# Patient Record
Sex: Male | Born: 1950 | Race: White | Hispanic: No | Smoking: Never smoker
Health system: Southern US, Community
[De-identification: ages and names within clinical notes are randomized; demographics above are authoritative.]

## PROBLEM LIST (undated history)

## (undated) DIAGNOSIS — C61 Malignant neoplasm of prostate: Secondary | ICD-10-CM

## (undated) DIAGNOSIS — Z800 Family history of malignant neoplasm of digestive organs: Secondary | ICD-10-CM

## (undated) DIAGNOSIS — Z86010 Personal history of colonic polyps: Principal | ICD-10-CM

## (undated) DIAGNOSIS — Z87898 Personal history of other specified conditions: Secondary | ICD-10-CM

## (undated) DIAGNOSIS — R350 Frequency of micturition: Secondary | ICD-10-CM

## (undated) DIAGNOSIS — E119 Type 2 diabetes mellitus without complications: Secondary | ICD-10-CM

## (undated) DIAGNOSIS — Z87442 Personal history of urinary calculi: Secondary | ICD-10-CM

## (undated) DIAGNOSIS — E785 Hyperlipidemia, unspecified: Secondary | ICD-10-CM

## (undated) DIAGNOSIS — M1990 Unspecified osteoarthritis, unspecified site: Secondary | ICD-10-CM

## (undated) DIAGNOSIS — R7303 Prediabetes: Secondary | ICD-10-CM

## (undated) HISTORY — DX: Hyperlipidemia, unspecified: E78.5

## (undated) HISTORY — DX: Type 2 diabetes mellitus without complications: E11.9

## (undated) HISTORY — DX: Personal history of colonic polyps: Z86.010

## (undated) HISTORY — PX: COLONOSCOPY: SHX174

---

## 2014-12-20 ENCOUNTER — Encounter: Payer: Self-pay | Admitting: Internal Medicine

## 2015-01-22 ENCOUNTER — Encounter: Payer: Self-pay | Admitting: Gastroenterology

## 2015-01-28 ENCOUNTER — Encounter: Payer: Self-pay | Admitting: Gastroenterology

## 2015-02-20 ENCOUNTER — Ambulatory Visit (AMBULATORY_SURGERY_CENTER): Payer: Self-pay

## 2015-02-20 VITALS — Ht 68.5 in | Wt 250.0 lb

## 2015-02-20 DIAGNOSIS — Z1211 Encounter for screening for malignant neoplasm of colon: Secondary | ICD-10-CM

## 2015-02-20 MED ORDER — NA SULFATE-K SULFATE-MG SULF 17.5-3.13-1.6 GM/177ML PO SOLN: 1.0000 | Freq: Once | ORAL | Status: DC

## 2015-02-20 NOTE — Progress Notes (Signed)
No egg or soy allergies Not on home 02 No previous anesthesia complications No diet or weight loss meds 

## 2015-02-22 ENCOUNTER — Encounter: Payer: Self-pay | Admitting: Internal Medicine

## 2015-03-06 ENCOUNTER — Encounter: Payer: Self-pay | Admitting: Gastroenterology

## 2015-03-11 ENCOUNTER — Encounter: Payer: Self-pay | Admitting: Internal Medicine

## 2015-03-11 ENCOUNTER — Ambulatory Visit (AMBULATORY_SURGERY_CENTER): Payer: Medicare Other | Admitting: Internal Medicine

## 2015-03-11 VITALS — BP 117/66 | HR 57 | Temp 96.2°F | Resp 10 | Ht 68.5 in | Wt 250.0 lb

## 2015-03-11 DIAGNOSIS — Z1211 Encounter for screening for malignant neoplasm of colon: Secondary | ICD-10-CM | POA: Diagnosis not present

## 2015-03-11 DIAGNOSIS — D122 Benign neoplasm of ascending colon: Secondary | ICD-10-CM

## 2015-03-11 DIAGNOSIS — D123 Benign neoplasm of transverse colon: Secondary | ICD-10-CM

## 2015-03-11 DIAGNOSIS — Z860101 Personal history of adenomatous and serrated colon polyps: Secondary | ICD-10-CM

## 2015-03-11 DIAGNOSIS — Z800 Family history of malignant neoplasm of digestive organs: Secondary | ICD-10-CM | POA: Diagnosis not present

## 2015-03-11 DIAGNOSIS — Z86010 Personal history of colonic polyps: Secondary | ICD-10-CM

## 2015-03-11 HISTORY — DX: Personal history of colonic polyps: Z86.010

## 2015-03-11 HISTORY — DX: Personal history of adenomatous and serrated colon polyps: Z86.0101

## 2015-03-11 MED ORDER — SODIUM CHLORIDE 0.9 % IV SOLN: 500.0000 mL | INTRAVENOUS | Status: DC

## 2015-03-11 NOTE — Progress Notes (Signed)
Called to room to assist during endoscopic procedure.  Patient ID and intended procedure confirmed with present staff. Received instructions for my participation in the procedure from the performing physician.  

## 2015-03-11 NOTE — Patient Instructions (Addendum)
I found and removed 2 tiny polyps that look benign.  I will let you know pathology results and when to have another routine colonoscopy by mail.  You also have a condition called diverticulosis - common and not usually a problem. Please read the handout provided.  I appreciate the opportunity to care for you. Gatha Mayer, MD, FACG     YOU HAD AN ENDOSCOPIC PROCEDURE TODAY AT [facility]:   Refer to the procedure report that was given to you for any specific questions about what was found during the examination.  If the procedure report does not answer your questions, please call your gastroenterologist to clarify.  If you requested that your care partner not be given the details of your procedure findings, then the procedure report has been included in a sealed envelope for you to review at your convenience later.  YOU SHOULD EXPECT: Some feelings of bloating in the abdomen. Passage of more gas than usual.  Walking can help get rid of the air that was put into your GI tract during the procedure and reduce the bloating. If you had a lower endoscopy (such as a colonoscopy or flexible sigmoidoscopy) you may notice spotting of blood in your stool or on the toilet paper. If you underwent a bowel prep for your procedure, you may not have a normal bowel movement for a few days.  Please Note:  You might notice some irritation and congestion in your nose or some drainage.  This is from the oxygen used during your procedure.  There is no need for concern and it should clear up in a day or so.  SYMPTOMS TO REPORT IMMEDIATELY:   Following lower endoscopy (colonoscopy or flexible sigmoidoscopy):  Excessive amounts of blood in the stool  Significant tenderness or worsening of abdominal pains  Swelling of the abdomen that is new, acute  Fever of 100F or higher    For urgent or emergent issues, a gastroenterologist can be reached at any hour by calling [phone]  [phone].   DIET: Your first meal following the procedure should be a small meal and then it is ok to progress to your normal diet. Heavy or fried foods are harder to digest and may make you feel nauseous or bloated.  Likewise, meals heavy in dairy and vegetables can increase bloating.  Drink plenty of fluids but you should avoid alcoholic beverages for 24 hours.  ACTIVITY:  You should plan to take it easy for the rest of today and you should NOT DRIVE or use heavy machinery until tomorrow (because of the sedation medicines used during the test).    FOLLOW UP: Our staff will call the number listed on your records the next business day following your procedure to check on you and address any questions or concerns that you may have regarding the information given to you following your procedure. If we do not reach you, we will leave a message.  However, if you are feeling well and you are not experiencing any problems, there is no need to return our call.  We will assume that you have returned to your regular daily activities without incident.  If any biopsies were taken you will be contacted by phone or by letter within the next 1-3 weeks.  Please call us at [phone] if you have not heard about the biopsies in 3 weeks.    SIGNATURES/CONFIDENTIALITY: You and/or your care partner have signed paperwork which will be entered into your  electronic medical record.  These signatures attest to the fact that that the information above on your After Visit Summary has been reviewed and is understood.  Full responsibility of the confidentiality of this discharge information lies with you and/or your care-partner.   Resume medications. Information given on polyps and diverticulosis.

## 2015-03-11 NOTE — Progress Notes (Signed)
To recovery, report to Brown, RN, VSS. 

## 2015-03-11 NOTE — Op Note (Signed)
[facility]  [address] [place], [id number]   COLONOSCOPY PROCEDURE REPORT  PATIENT: Aiken, Aragona  MR#: [id number] BIRTHDATE: June 02, 1950 , 63  yrs. old GENDER: male ENDOSCOPIST: Gatha Mayer, MD, [facility] PROCEDURE DATE:  03/11/2015 PROCEDURE:   Colonoscopy, screening and Colonoscopy with snare polypectomy First Screening Colonoscopy - Avg.  risk and is 50 yrs.  old or older - No.  Prior Negative Screening - Now for repeat screening. 10 or more years since last screening  History of Adenoma - Now for follow-up colonoscopy & has been > or = to 3 yrs.  N/A  Polyps removed today? Yes ASA CLASS:   Class II INDICATIONS:Screening for colonic neoplasia and FH Colon or Rectal Adenocarcinoma. MEDICATIONS: Propofol 300 mg IV and Monitored anesthesia care  DESCRIPTION OF PROCEDURE:   After the risks benefits and alternatives of the procedure were thoroughly explained, informed consent was obtained.  The digital rectal exam revealed no abnormalities of the rectum.   The LB [id number]  endoscope was introduced through the anus and advanced to the cecum, which was identified by both the appendix and ileocecal valve. No adverse events experienced.   The quality of the prep was excellent. (MiraLax was used)  The instrument was then slowly withdrawn as the colon was fully examined. Estimated blood loss is zero unless otherwise noted in this procedure report.      COLON FINDINGS: Two sessile polyps ranging from 3 to 20mm in size were found in the transverse colon and ascending colon. Polypectomies were performed with a cold snare.  The resection was complete, the polyp tissue was completely retrieved and sent to histology.   The examination was otherwise normal.   Right colon retroflexion included.  Retroflexed views revealed no abnormalities. The time to cecum = 1.6 Withdrawal time = 11.9   The scope was withdrawn and the procedure completed. COMPLICATIONS: There  were no immediate complications.  ENDOSCOPIC IMPRESSION: 1.   Two sessile polyps ranging from 3 to 22mm in size were found in the transverse colon and ascending colon; polypectomies were performed with a cold snare 2.   The examination was otherwise normal 3.   Right colon retroflexion included  RECOMMENDATIONS: Timing of repeat colonoscopy will be determined by pathology findings and family hx issues (mother 98's father 76's) Likely 2021 eSigned:  Gatha Mayer, MD, [facility] 03/11/2015 9:34 AM   cc: Janalyn Rouse, MD and The Patient

## 2015-03-12 ENCOUNTER — Telehealth: Payer: Self-pay | Admitting: *Deleted

## 2015-03-12 NOTE — Telephone Encounter (Signed)
On f/u callback-voice mailbox full not accepting messages

## 2015-03-24 ENCOUNTER — Encounter: Payer: Self-pay | Admitting: Internal Medicine

## 2015-03-24 DIAGNOSIS — Z86010 Personal history of colonic polyps: Secondary | ICD-10-CM | POA: Insufficient documentation

## 2015-03-24 DIAGNOSIS — Z860101 Personal history of adenomatous and serrated colon polyps: Secondary | ICD-10-CM | POA: Insufficient documentation

## 2015-03-24 NOTE — Progress Notes (Signed)
Quick Note:  2 diminutive adenomas + FFHx CRCA Recall colon 2021 ______

## 2015-07-14 ENCOUNTER — Encounter (HOSPITAL_COMMUNITY): Payer: Self-pay | Admitting: *Deleted

## 2015-07-14 ENCOUNTER — Emergency Department (HOSPITAL_COMMUNITY)
Admission: EM | Admit: 2015-07-14 | Discharge: 2015-07-14 | Disposition: A | Discharge status: Home / Self Care | Payer: Medicare Other | Attending: Physician Assistant | Admitting: Physician Assistant

## 2015-07-14 DIAGNOSIS — E669 Obesity, unspecified: Secondary | ICD-10-CM | POA: Diagnosis not present

## 2015-07-14 DIAGNOSIS — Z86010 Personal history of colonic polyps: Secondary | ICD-10-CM | POA: Diagnosis not present

## 2015-07-14 DIAGNOSIS — R509 Fever, unspecified: Secondary | ICD-10-CM | POA: Diagnosis present

## 2015-07-14 DIAGNOSIS — A084 Viral intestinal infection, unspecified: Secondary | ICD-10-CM | POA: Diagnosis not present

## 2015-07-14 LAB — URINE MICROSCOPIC-ADD ON
Bacteria, UA: NONE SEEN
SQUAMOUS EPITHELIAL / LPF: NONE SEEN
WBC UA: NONE SEEN WBC/hpf (ref 0–5)

## 2015-07-14 LAB — URINALYSIS, ROUTINE W REFLEX MICROSCOPIC
BILIRUBIN URINE: NEGATIVE
Glucose, UA: NEGATIVE mg/dL
KETONES UR: NEGATIVE mg/dL
Leukocytes, UA: NEGATIVE
NITRITE: NEGATIVE
Protein, ur: NEGATIVE mg/dL
Specific Gravity, Urine: 1.018 (ref 1.005–1.030)
pH: 7 (ref 5.0–8.0)

## 2015-07-14 LAB — CBC
HCT: 42.2 % (ref 39.0–52.0)
Hemoglobin: 14.3 g/dL (ref 13.0–17.0)
MCH: 28.9 pg (ref 26.0–34.0)
MCHC: 33.9 g/dL (ref 30.0–36.0)
MCV: 85.3 fL (ref 78.0–100.0)
Platelets: 207 10*3/uL (ref 150–400)
RBC: 4.95 MIL/uL (ref 4.22–5.81)
RDW: 12.4 % (ref 11.5–15.5)
WBC: 8.4 10*3/uL (ref 4.0–10.5)

## 2015-07-14 LAB — COMPREHENSIVE METABOLIC PANEL
ALBUMIN: 3.9 g/dL (ref 3.5–5.0)
ALK PHOS: 90 U/L (ref 38–126)
ALT: 18 U/L (ref 17–63)
AST: 21 U/L (ref 15–41)
Anion gap: 14 (ref 5–15)
BILIRUBIN TOTAL: 0.8 mg/dL (ref 0.3–1.2)
BUN: 13 mg/dL (ref 6–20)
CALCIUM: 9.2 mg/dL (ref 8.9–10.3)
CO2: 20 mmol/L — ABNORMAL LOW (ref 22–32)
CREATININE: 1.13 mg/dL (ref 0.61–1.24)
Chloride: 104 mmol/L (ref 101–111)
GFR calc Af Amer: >60 mL/min (ref >60)
GFR calc non Af Amer: >60 mL/min (ref >60)
GLUCOSE: 172 mg/dL — AB (ref 65–99)
Potassium: 4.3 mmol/L (ref 3.5–5.1)
Sodium: 138 mmol/L (ref 135–145)
TOTAL PROTEIN: 7 g/dL (ref 6.5–8.1)

## 2015-07-14 LAB — LIPASE, BLOOD: Lipase: 24 U/L (ref 11–51)

## 2015-07-14 MED ORDER — ONDANSETRON 4 MG PO TBDP
ORAL_TABLET | ORAL | Status: AC
  Filled 2015-07-14: qty 1

## 2015-07-14 MED ORDER — ONDANSETRON HCL 4 MG PO TABS: 4.0000 mg | ORAL_TABLET | Freq: Three times a day (TID) | ORAL | Status: DC | PRN

## 2015-07-14 MED ORDER — SODIUM CHLORIDE 0.9 % IV BOLUS (SEPSIS)
1000.0000 mL | Freq: Once | INTRAVENOUS | Status: AC
  Administered 2015-07-14: 1000 mL via INTRAVENOUS

## 2015-07-14 MED ORDER — ONDANSETRON 4 MG PO TBDP
4.0000 mg | ORAL_TABLET | Freq: Once | ORAL | Status: AC | PRN
  Administered 2015-07-14: 4 mg via ORAL

## 2015-07-14 NOTE — ED Provider Notes (Signed)
CSN: [id number]     Arrival date & time 07/14/15  1058 History   First MD Initiated Contact with Patient 07/14/15 1219     Chief Complaint  Patient presents with  . Abdominal Pain  . Fever     (Consider location/radiation/quality/duration/timing/severity/associated sxs/prior Treatment) HPI   Pt is a 65 year old presents with nausea vomiting.  Vomited several times between 10 and now today. No diarrhea yet.  NO fever. Endorses chills. Patient had no abnormal eating behavior.   Normal bowel movement here. + Gas.     Past Medical History  Diagnosis Date  . Hx of adenomatous colonic polyps 03/24/2015  . Obesity    Past Surgical History  Procedure Laterality Date  . Hand surgery Left Jan 2016    left thumb  . Colonoscopy     Family History  Problem Relation Age of Onset  . Colon cancer Mother 70  . Colon cancer Father 19   Social History  Substance Use Topics  . Smoking status: Never Smoker   . Smokeless tobacco: Never Used  . Alcohol Use: 0.0 oz/week    0 Standard drinks or equivalent per week     Comment: occasionally    Review of Systems  Constitutional: Positive for chills. Negative for fever and activity change.  Respiratory: Negative for shortness of breath.   Cardiovascular: Negative for chest pain.  Gastrointestinal: Positive for nausea and vomiting. Negative for abdominal pain and diarrhea.  Genitourinary: Negative for dysuria.  Psychiatric/Behavioral: Negative for agitation.      Allergies  Review of patient's allergies indicates no known allergies.  Home Medications   Prior to Admission medications   Medication Sig Start Date End Date Taking? Authorizing Provider  ondansetron (ZOFRAN) 4 MG tablet Take 1 tablet (4 mg total) by mouth every 8 (eight) hours as needed for nausea or vomiting. 07/14/15   Ebubechukwu Jedlicka Lyn Aiyla Baucom, MD   BP 100/64 mmHg  Pulse 63  Temp(Src) 98.2 F (36.8 C) (Oral)  Resp 18  SpO2 97% Physical Exam  Constitutional: He is  oriented to person, place, and time. He appears well-nourished.  HENT:  Head: Normocephalic.  Mouth/Throat: Oropharynx is clear and moist.  Eyes: Conjunctivae are normal.  Neck: No tracheal deviation present.  Cardiovascular: Normal rate.   Pulmonary/Chest: Effort normal. No stridor. No respiratory distress.  Abdominal: Soft. There is no tenderness. There is no guarding.  No tenderness  Musculoskeletal: Normal range of motion. He exhibits no edema.  Neurological: He is oriented to person, place, and time. No cranial nerve deficit.  Skin: Skin is warm and dry. No rash noted. He is not diaphoretic.  Psychiatric: He has a normal mood and affect. His behavior is normal.  Nursing note and vitals reviewed.   ED Course  Procedures (including critical care time) Labs Review Labs Reviewed  COMPREHENSIVE METABOLIC PANEL - Abnormal; Notable for the following:    CO2 20 (*)    Glucose, Bld 172 (*)    All other components within normal limits  URINALYSIS, ROUTINE W REFLEX MICROSCOPIC (NOT AT [facility]) - Abnormal; Notable for the following:    Hgb urine dipstick LARGE (*)    All other components within normal limits  LIPASE, BLOOD  CBC  URINE MICROSCOPIC-ADD ON    Imaging Review No results found. I have personally reviewed and evaluated these images and lab results as part of my medical decision-making.   EKG Interpretation   Date/Time:  Sunday July 14 2015 16:33:02 EDT Ventricular Rate:  60 PR Interval:  143 QRS Duration: 91 QT Interval:  408 QTC Calculation: 408 R Axis:   -59 Text Interpretation:  Sinus rhythm Left anterior fascicular block Low  voltage, precordial leads Consider anterior infarct Baseline wander in  lead(s) V3 no acute ischemia  No old tracing to compare Confirmed by  Gerald Leitz ([id number]) on 07/14/2015 4:45:07 PM      MDM   Final diagnoses:  Viral gastroenteritis    Patietn is a 65 year old male presenting With feeling like he is going to gag 2-3  times. Patient vomited one time. No vomiting. This all happened within the last 2 hours. Patient has no fever, chills, abdominal pain. Patient feels a little gassy. We'll give Zofran and try by mouth challenge.  It appears to me this is the start of viral gastroenteritis which is going around the community.   4:45 PM Patient has not vomited here. Patient is able to tolerate ice chips without issue. He feels much improved. We'll have him follow-up with our care physician as needed.  Meagan Ancona Julio Alm, MD 07/14/15 1645

## 2015-07-14 NOTE — Discharge Instructions (Signed)
Please return with any chest pain, or other concerns.   Viral Gastroenteritis Viral gastroenteritis is also known as stomach flu. This condition affects the stomach and intestinal tract. It can cause sudden diarrhea and vomiting. The illness typically lasts 3 to 8 days. Most people develop an immune response that eventually gets rid of the virus. While this natural response develops, the virus can make you quite ill. CAUSES  Many different viruses can cause gastroenteritis, such as rotavirus or noroviruses. You can catch one of these viruses by consuming contaminated food or water. You may also catch a virus by sharing utensils or other personal items with an infected person or by touching a contaminated surface. SYMPTOMS  The most common symptoms are diarrhea and vomiting. These problems can cause a severe loss of body fluids (dehydration) and a body salt (electrolyte) imbalance. Other symptoms may include:  Fever.  Headache.  Fatigue.  Abdominal pain. DIAGNOSIS  Your caregiver can usually diagnose viral gastroenteritis based on your symptoms and a physical exam. A stool sample may also be taken to test for the presence of viruses or other infections. TREATMENT  This illness typically goes away on its own. Treatments are aimed at rehydration. The most serious cases of viral gastroenteritis involve vomiting so severely that you are not able to keep fluids down. In these cases, fluids must be given through an intravenous line (IV). HOME CARE INSTRUCTIONS   Drink enough fluids to keep your urine clear or pale yellow. Drink small amounts of fluids frequently and increase the amounts as tolerated.  Ask your caregiver for specific rehydration instructions.  Avoid:  Foods high in sugar.  Alcohol.  Carbonated drinks.  Tobacco.  Juice.  Caffeine drinks.  Extremely hot or cold fluids.  Fatty, greasy foods.  Too much intake of anything at one time.  Dairy products until 24 to 48  hours after diarrhea stops.  You may consume probiotics. Probiotics are active cultures of beneficial bacteria. They may lessen the amount and number of diarrheal stools in adults. Probiotics can be found in yogurt with active cultures and in supplements.  Wash your hands well to avoid spreading the virus.  Only take over-the-counter or prescription medicines for pain, discomfort, or fever as directed by your caregiver. Do not give aspirin to children. Antidiarrheal medicines are not recommended.  Ask your caregiver if you should continue to take your regular prescribed and over-the-counter medicines.  Keep all follow-up appointments as directed by your caregiver. SEEK IMMEDIATE MEDICAL CARE IF:   You are unable to keep fluids down.  You do not urinate at least once every 6 to 8 hours.  You develop shortness of breath.  You notice blood in your stool or vomit. This may look like coffee grounds.  You have abdominal pain that increases or is concentrated in one small area (localized).  You have persistent vomiting or diarrhea.  You have a fever.  The patient is a child younger than 3 months, and he or she has a fever.  The patient is a child older than 3 months, and he or she has a fever and persistent symptoms.  The patient is a child older than 3 months, and he or she has a fever and symptoms suddenly get worse.  The patient is a baby, and he or she has no tears when crying. MAKE SURE YOU:   Understand these instructions.  Will watch your condition.  Will get help right away if you are not doing well or  get worse.   This information is not intended to replace advice given to you by your health care provider. Make sure you discuss any questions you have with your health care provider.   Document Released: 04/13/2005 Document Revised: 07/06/2011 Document Reviewed: 01/28/2011 Elsevier Interactive Patient Education [employer].

## 2015-07-14 NOTE — ED Notes (Signed)
Pt reports onset this morning of RLQ pressure, chills/fever and n/v. Denies diarrhea.

## 2015-07-18 ENCOUNTER — Other Ambulatory Visit (HOSPITAL_COMMUNITY): Payer: Self-pay | Admitting: Internal Medicine

## 2015-07-18 ENCOUNTER — Ambulatory Visit (HOSPITAL_COMMUNITY)
Admission: RE | Admit: 2015-07-18 | Discharge: 2015-07-18 | Disposition: A | Discharge status: Home / Self Care | Payer: Medicare Other | Source: Ambulatory Visit | Attending: Internal Medicine | Admitting: Internal Medicine

## 2015-07-18 DIAGNOSIS — R339 Retention of urine, unspecified: Secondary | ICD-10-CM

## 2015-07-18 DIAGNOSIS — N329 Bladder disorder, unspecified: Secondary | ICD-10-CM | POA: Insufficient documentation

## 2015-07-18 DIAGNOSIS — N200 Calculus of kidney: Secondary | ICD-10-CM | POA: Diagnosis not present

## 2015-07-18 DIAGNOSIS — N132 Hydronephrosis with renal and ureteral calculous obstruction: Secondary | ICD-10-CM | POA: Diagnosis not present

## 2015-07-18 DIAGNOSIS — N201 Calculus of ureter: Secondary | ICD-10-CM | POA: Diagnosis not present

## 2016-05-05 HISTORY — PX: PROSTATE BIOPSY: SHX241

## 2016-05-22 ENCOUNTER — Encounter: Payer: Self-pay | Admitting: Radiation Oncology

## 2016-06-03 NOTE — Progress Notes (Signed)
GU Location of Tumor / Histology: prostatic adenocarcinoma  If Prostate Cancer, Gleason Score is (3 + 3= 6). Prostate volume 31.7 grams.    Biopsies of prostate (if applicable) revealed:    Past/Anticipated interventions by urology, if any: biopsy, referral to Dr. Tammi Klippel to discuss radiation therapy options. Patient most interested in seeds.  Past/Anticipated interventions by medical oncology, if any: no  Weight changes, if any: no  Bowel/Bladder complaints, if any: IPSS 2 with nocturia x 2. Denies dysuria, hematuria, incontinence, or leakage.   Nausea/Vomiting, if any: no  Pain issues, if any:  no  SAFETY ISSUES:  Prior radiation? no  Pacemaker/ICD? no  Possible current pregnancy? no  Is the patient on methotrexate? no  Current Complaints / other details: 66 year old male. Married. Retired.

## 2016-06-08 ENCOUNTER — Encounter: Payer: Self-pay | Admitting: Medical Oncology

## 2016-06-08 ENCOUNTER — Ambulatory Visit
Admission: RE | Admit: 2016-06-08 | Discharge: 2016-06-08 | Disposition: A | Discharge status: Home / Self Care | Payer: Medicare Other | Source: Ambulatory Visit | Attending: Radiation Oncology | Admitting: Radiation Oncology

## 2016-06-08 ENCOUNTER — Encounter: Payer: Self-pay | Admitting: Radiation Oncology

## 2016-06-08 VITALS — BP 134/97 | HR 89 | Resp 18 | Ht 68.0 in | Wt 244.0 lb

## 2016-06-08 DIAGNOSIS — E119 Type 2 diabetes mellitus without complications: Secondary | ICD-10-CM | POA: Insufficient documentation

## 2016-06-08 DIAGNOSIS — C61 Malignant neoplasm of prostate: Secondary | ICD-10-CM

## 2016-06-08 DIAGNOSIS — Z800 Family history of malignant neoplasm of digestive organs: Secondary | ICD-10-CM | POA: Diagnosis not present

## 2016-06-08 DIAGNOSIS — E669 Obesity, unspecified: Secondary | ICD-10-CM | POA: Diagnosis not present

## 2016-06-08 DIAGNOSIS — Z833 Family history of diabetes mellitus: Secondary | ICD-10-CM | POA: Diagnosis not present

## 2016-06-08 DIAGNOSIS — Z86010 Personal history of colonic polyps: Secondary | ICD-10-CM | POA: Diagnosis not present

## 2016-06-08 DIAGNOSIS — Z6837 Body mass index (BMI) 37.0-37.9, adult: Secondary | ICD-10-CM | POA: Diagnosis not present

## 2016-06-08 DIAGNOSIS — Z9889 Other specified postprocedural states: Secondary | ICD-10-CM | POA: Diagnosis not present

## 2016-06-08 DIAGNOSIS — Z7982 Long term (current) use of aspirin: Secondary | ICD-10-CM | POA: Diagnosis not present

## 2016-06-08 HISTORY — DX: Malignant neoplasm of prostate: C61

## 2016-06-08 NOTE — Progress Notes (Signed)
See progress note under physician encounter. 

## 2016-06-08 NOTE — Progress Notes (Signed)
Radiation Oncology         [phone] ________________________________  Initial outpatient Consultation  Name: Calvin Salazar MRN: [id number]  Date: 06/08/2016  DOB: 09/29/1950  [id number], Gwyndolyn Saxon, MD  McKenzie, Candee Furbish, MD   REFERRING PHYSICIAN: Cleon Gustin, MD  DIAGNOSIS: 65 year-old gentleman with Stage T1c adenocarcinoma of the prostate with Gleason Score of 3+3, and PSA of 6.9.     ICD-9-CM ICD-10-CM   1. Malignant neoplasm of prostate ([place]) 185 C61     HISTORY OF PRESENT ILLNESS: Calvin Salazar is a 66 y.o. male with a diagnosis of prostate cancer. He was noted to have an elevated PSA of 4.03 December 2015 by his primary care physician, Dr. Brigitte Pulse. Subsequent PSA levels have increased with his most recent PSA being 6.02 January 2016. Accordingly, he was referred for evaluation in urology by Dr. Alyson Ingles on 05/05/2016.  He had not had a prostate nodule on a physical examination.  The patient proceeded to transrectal ultrasound with 12 biopsies of the prostate on 05/05/2016.  The prostate volume measured 31.7 cc.  Out of 12 core biopsies, 10 were positive.  The maximum Gleason score was 3+3, and this was seen in the left base lateral, left mid lateral, left apex lateral, left base, left mid, right base, right mid, right apex, right base lateral, and right apex lateral.  The patient reviewed the biopsy results with his urologist and he has kindly been referred today for discussion of potential radiation treatment options.      PREVIOUS RADIATION THERAPY: No  PAST MEDICAL HISTORY:  Past Medical History:  Diagnosis Date  . Diabetes mellitus without complication ([place])   . Hx of adenomatous colonic polyps 03/24/2015  . Obesity   . Prostate cancer ([place])       PAST SURGICAL HISTORY: Past Surgical History:  Procedure Laterality Date  . COLONOSCOPY    . HAND SURGERY Left Jan 2016   left thumb  . PROSTATE BIOPSY      FAMILY HISTORY:  Family History  Problem Relation  Age of Onset  . Colon cancer Mother 19    intestines  . Colon cancer Father 60  . Diabetes Maternal Grandmother     SOCIAL HISTORY:  Social History   Social History  . Marital status: Unknown    Spouse name: N/A  . Number of children: N/A  . Years of education: N/A   Occupational History  . Not on file.   Social History Main Topics  . Smoking status: Never Smoker  . Smokeless tobacco: Never Used  . Alcohol use 0.0 oz/week     Comment: occasionally  . Drug use: No  . Sexual activity: Not on file   Other Topics Concern  . Not on file   Social History Narrative  . No narrative on file    ALLERGIES: Patient has no known allergies.  MEDICATIONS:  Current Outpatient Prescriptions  Medication Sig Dispense Refill  . aspirin EC 81 MG tablet Take 81 mg by mouth daily.    Marland Kitchen CINNAMON PO Take by mouth.    Marland Kitchen guaiFENesin (MUCINEX) 600 MG 12 hr tablet Take by mouth 2 (two) times daily.     No current facility-administered medications for this encounter.     REVIEW OF SYSTEMS:  On review of systems, the patient reports that he is doing well overall. He denies any chest pain, shortness of breath, cough, fevers, chills, night sweats, unintended weight changes. He denies any bowel disturbances, and denies  abdominal pain, nausea or vomiting. He denies any new musculoskeletal or joint aches or pains. His IPSS today was 2, indicating mild urinary symptoms. He reports nocturia x 2. He denies dysuria, hematuria, incontinence, or leakage. Previously on 05/21/2016 his IPSS was reported as 12, indicating moderate urinary symptoms. He is able to complete sexual activity with most attempts. A complete review of systems is obtained and is otherwise negative.    PHYSICAL EXAM:  Wt Readings from Last 3 Encounters:  06/08/16 244 lb (110.7 kg)  03/11/15 250 lb (113.4 kg)  02/20/15 250 lb (113.4 kg)   Temp Readings from Last 3 Encounters:  07/14/15 98.2 F (36.8 C) (Oral)  03/11/15 (!) 96.2 F  (35.7 C) (Tympanic)   BP Readings from Last 3 Encounters:  06/08/16 (!) 134/97  07/14/15 123/83  03/11/15 117/66   Pulse Readings from Last 3 Encounters:  06/08/16 89  07/14/15 70  03/11/15 (!) 57   Pain Assessment Pain Score: 0-No pain/10  In general this is a well appearing caucasian male in no acute distress. He's alert and oriented x4 and appropriate throughout the examination. Cardiopulmonary assessment is negative for acute distress and he exhibits normal effort.    KPS = 100  100 - Normal; no complaints; no evidence of disease. 90   - Able to carry on normal activity; minor signs or symptoms of disease. 80   - Normal activity with effort; some signs or symptoms of disease. 62   - Cares for self; unable to carry on normal activity or to do active work. 60   - Requires occasional assistance, but is able to care for most of his personal needs. 50   - Requires considerable assistance and frequent medical care. 65   - Disabled; requires special care and assistance. 2   - Severely disabled; hospital admission is indicated although death not imminent. 44   - Very sick; hospital admission necessary; active supportive treatment necessary. 10   - Moribund; fatal processes progressing rapidly. 0     - Dead  Karnofsky DA, Abelmann [place], Craver LS and Burchenal [facility] [phone]) The use of the nitrogen mustards in the palliative treatment of carcinoma: with particular reference to bronchogenic carcinoma Cancer 1 634-56  LABORATORY DATA:  Lab Results  Component Value Date   WBC 8.4 07/14/2015   HGB 14.3 07/14/2015   HCT 42.2 07/14/2015   MCV 85.3 07/14/2015   PLT 207 07/14/2015   Lab Results  Component Value Date   NA 138 07/14/2015   K 4.3 07/14/2015   CL 104 07/14/2015   CO2 20 (L) 07/14/2015   Lab Results  Component Value Date   ALT 18 07/14/2015   AST 21 07/14/2015   ALKPHOS 90 07/14/2015   BILITOT 0.8 07/14/2015     RADIOGRAPHY: No results found.     IMPRESSION/PLAN: 1. 66 y.o. gentleman with low risk, Stage T1c adenocarcinoma of the prostate with Gleason Score of 3+3, and PSA of 6.9.  We discussed the natural history of prostate cancer.  We reviewed the the implications of T-stage, Gleason's Score, and PSA on decision-making and outcomes in prostate cancer.  We discussed radiation treatment in the management of prostate cancer with regard to the logistics and delivery of external beam radiation treatment as well as the logistics and delivery of prostate brachytherapy.  We compared and contrasted each of these approaches and also compared these against prostatectomy.  The patient expressed interest in prostate brachytherapy. The patient would like to proceed with  prostate brachytherapy.  I will share my findings with Dr. Alyson Ingles and move forward with scheduling the procedure in the near future.     ------------------------------------------------   Tyler Pita, MD [place] Director and Director of Stereotactic Radiosurgery Direct Dial: [phone]  Fax: [phone] [email or web address]  Skype  [email or web address]  This document serves as a record of services personally performed by Tyler Pita, MD. It was created on his behalf by Arlyce Harman, a trained medical scribe. The creation of this record is based on the scribe's personal observations and the provider's statements to them. This document has been checked and approved by the attending provider.

## 2016-06-10 ENCOUNTER — Other Ambulatory Visit: Payer: Self-pay | Admitting: Urology

## 2016-06-10 ENCOUNTER — Telehealth: Payer: Self-pay | Admitting: *Deleted

## 2016-06-10 NOTE — Telephone Encounter (Signed)
Called patient to inform of pre-seed planning CT on 06-19-16 and his implant on 08-17-16 @ 2 pm, spoke with patient and he is aware of these appts.

## 2016-06-18 ENCOUNTER — Telehealth: Payer: Self-pay | Admitting: *Deleted

## 2016-06-18 NOTE — Telephone Encounter (Signed)
xxxxx 

## 2016-06-18 NOTE — Telephone Encounter (Signed)
Called patient to remind of pre-seed appts. For 06-19-16, spoke with patient and he is aware of these appts.

## 2016-06-19 ENCOUNTER — Ambulatory Visit (HOSPITAL_COMMUNITY)
Admission: RE | Admit: 2016-06-19 | Discharge: 2016-06-19 | Disposition: A | Discharge status: Home / Self Care | Payer: Medicare Other | Source: Ambulatory Visit | Attending: Urology | Admitting: Urology

## 2016-06-19 ENCOUNTER — Ambulatory Visit
Admission: RE | Admit: 2016-06-19 | Discharge: 2016-06-19 | Disposition: A | Discharge status: Home / Self Care | Payer: Medicare Other | Source: Ambulatory Visit | Attending: Radiation Oncology | Admitting: Radiation Oncology

## 2016-06-19 ENCOUNTER — Encounter: Payer: Self-pay | Admitting: Medical Oncology

## 2016-06-19 ENCOUNTER — Other Ambulatory Visit: Payer: Self-pay

## 2016-06-19 DIAGNOSIS — C61 Malignant neoplasm of prostate: Secondary | ICD-10-CM

## 2016-06-19 DIAGNOSIS — Z01818 Encounter for other preprocedural examination: Secondary | ICD-10-CM | POA: Insufficient documentation

## 2016-06-19 NOTE — Progress Notes (Signed)
  Radiation Oncology         (336) [phone] ________________________________  Name: Calvin Salazar MRN: [id number]  Date: 06/19/2016  DOB: 1950/09/03  SIMULATION AND TREATMENT PLANNING NOTE PUBIC ARCH STUDY  [id number], Gwyndolyn Saxon, MD  Cleon Gustin, MD  DIAGNOSIS: 66 y.o. gentleman with Stage T1c adenocarcinoma of the prostate with Gleason Score of 3+3, and PSA of 6.9    ICD-9-CM ICD-10-CM   1. Malignant neoplasm of prostate ([place]) 185 C61     COMPLEX SIMULATION:  The patient presented today for evaluation for possible prostate seed implant. He was brought to the radiation planning suite and placed supine on the CT couch. A 3-dimensional image study set was obtained in upload to the planning computer. There, on each axial slice, I contoured the prostate gland. Then, using three-dimensional radiation planning tools I reconstructed the prostate in view of the structures from the transperineal needle pathway to assess for possible pubic arch interference. In doing so, I did not appreciate any pubic arch interference. Also, the patient's prostate volume was estimated based on the drawn structure. The volume was 30 cc.  Given the pubic arch appearance and prostate volume, patient remains a good candidate to proceed with prostate seed implant. Today, he freely provided informed written consent to proceed.     PLAN: The patient will undergo prostate seed implant.   ________________________________  Sheral Apley. Tammi Klippel, M.D.  This document serves as a record of services personally performed by Tyler Pita, MD. It was created on his behalf by Darcus Austin, a trained medical scribe. The creation of this record is based on the scribe's personal observations and the provider's statements to them. This document has been checked and approved by the attending provider.

## 2016-07-01 ENCOUNTER — Other Ambulatory Visit: Payer: Self-pay | Admitting: Urology

## 2016-07-12 IMAGING — CT CT RENAL STONE PROTOCOL
2 of 3 series · 16 of 45 positions shown, 18 images · non-contrast
Comparison: None.

CLINICAL DATA: Urinary retention for 3 days. Right greater than
left flank pain.

EXAM:
CT ABDOMEN AND PELVIS WITHOUT CONTRAST
TECHNIQUE: Multidetector CT imaging of the abdomen and pelvis was performed
following the standard protocol without IV contrast.

[Series 3: coronal · coronal · 0.84mm/px · 3 of 111 slices shown]
[im 37/111  soft-tissue]
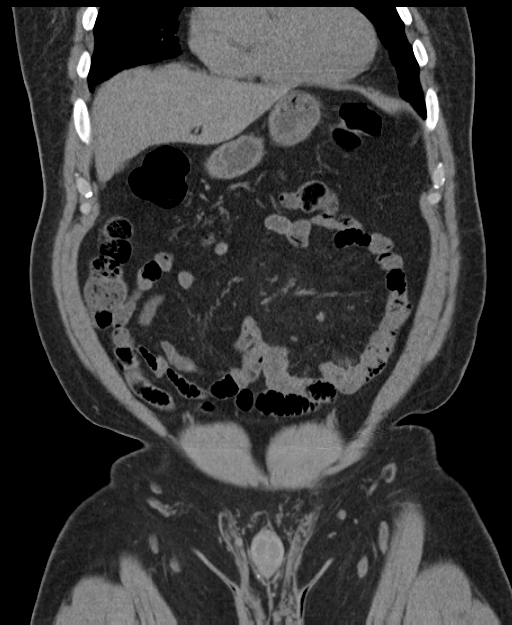
[im 49/111  soft-tissue]
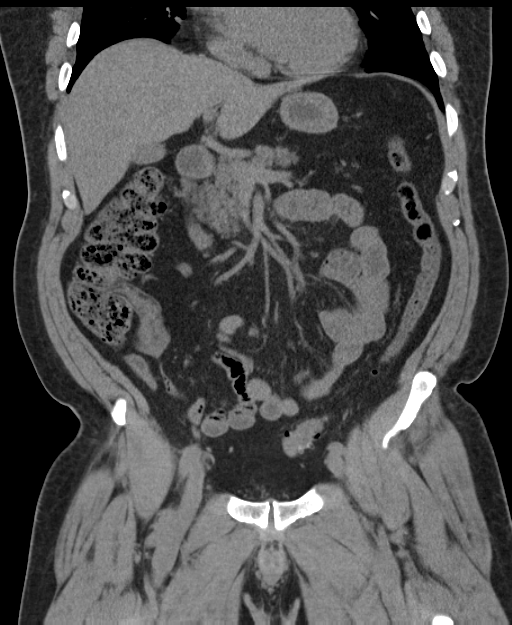
[im 62/111  soft-tissue]
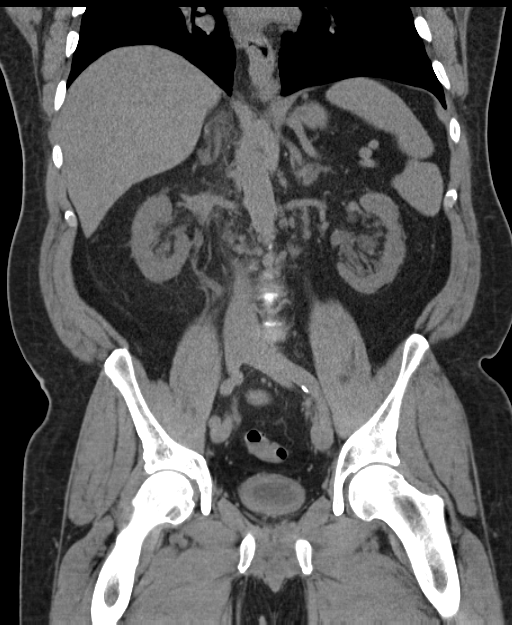

[Series 6: lung · axial · 0.86mm/px · z∈[+1361,+1491]mm · 13 of 31 slices shown, 15 images]
[im 3/31  soft-tissue]
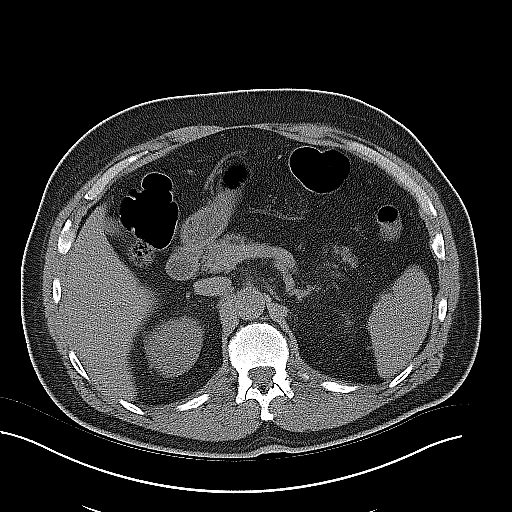
[im 3/31  bone]
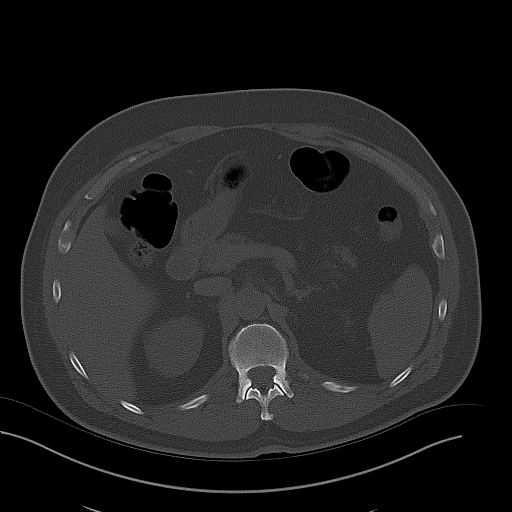
[im 5/31  soft-tissue]
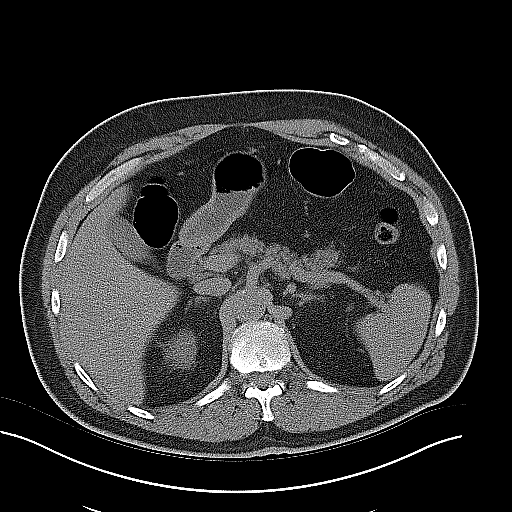
[im 7/31  soft-tissue]
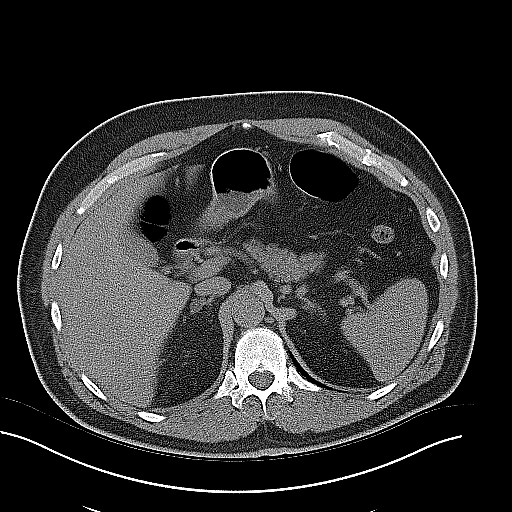
[im 9/31  soft-tissue]
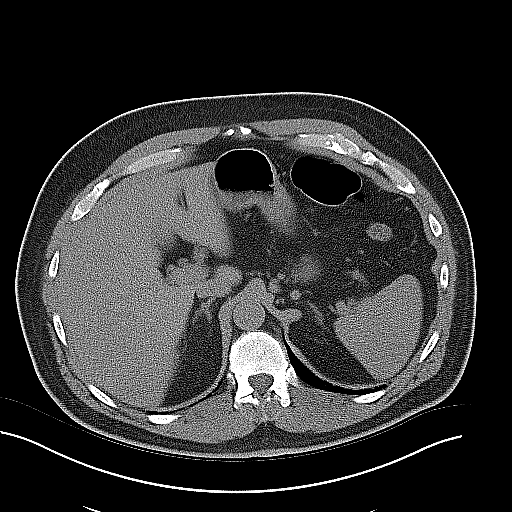
[im 12/31  soft-tissue]
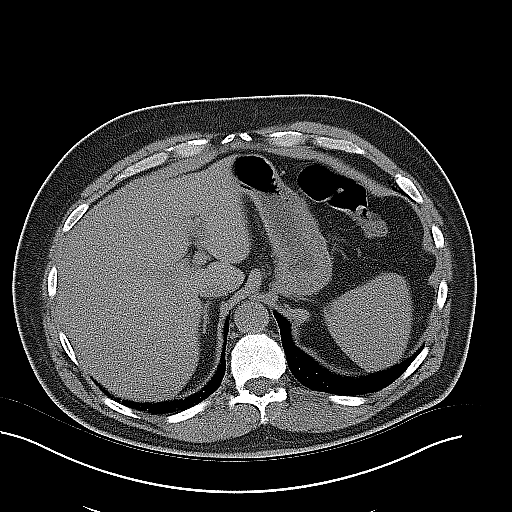
[im 14/31  soft-tissue]
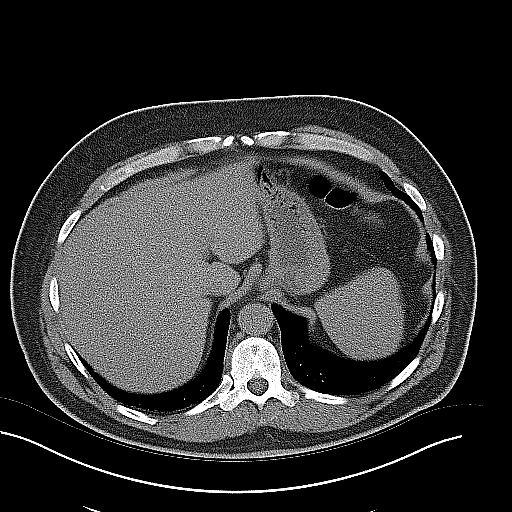
[im 16/31  soft-tissue]
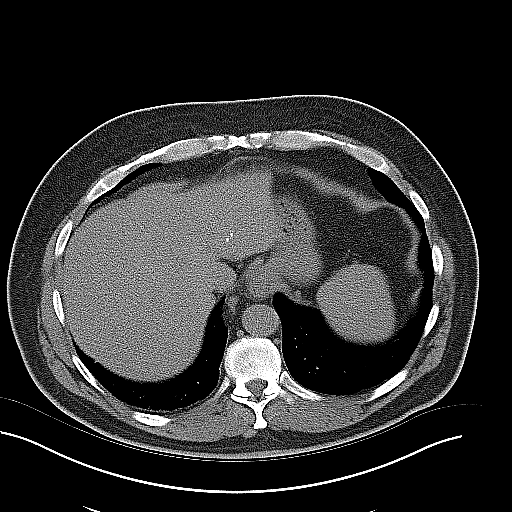
[im 18/31  soft-tissue]
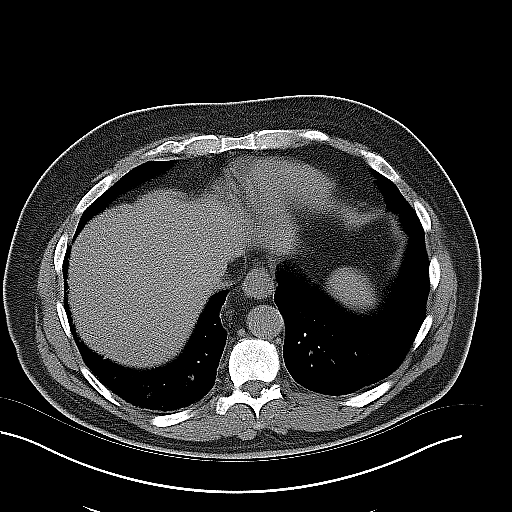
[im 20/31  soft-tissue]
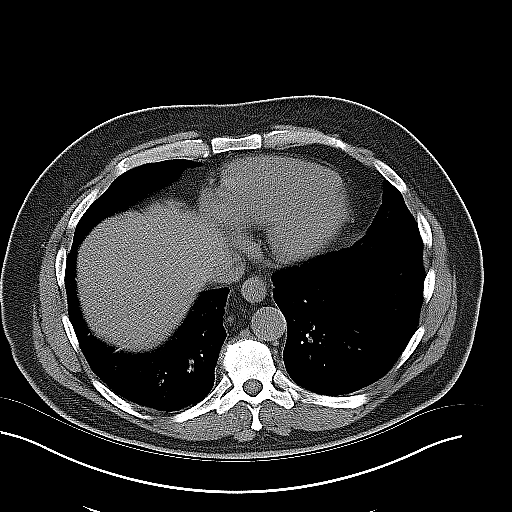
[im 20/31  bone]
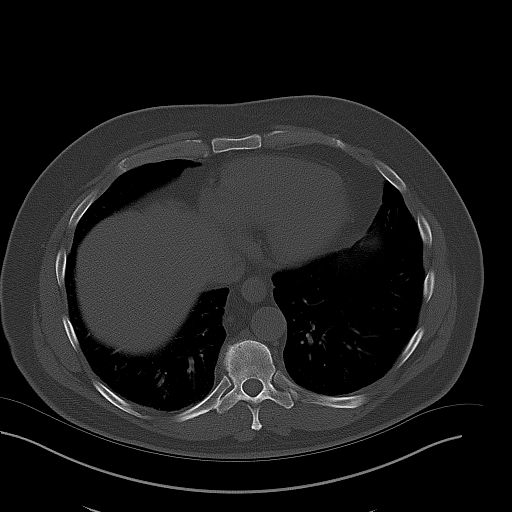
[im 23/31  soft-tissue]
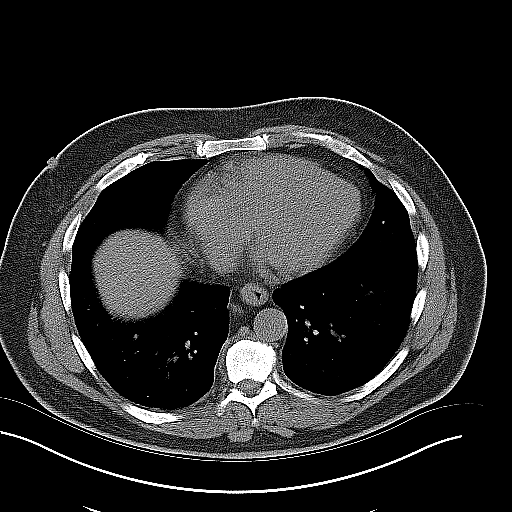
[im 25/31  soft-tissue]
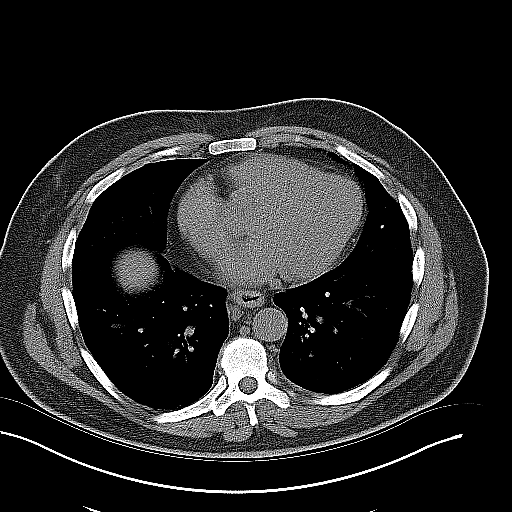
[im 27/31  soft-tissue]
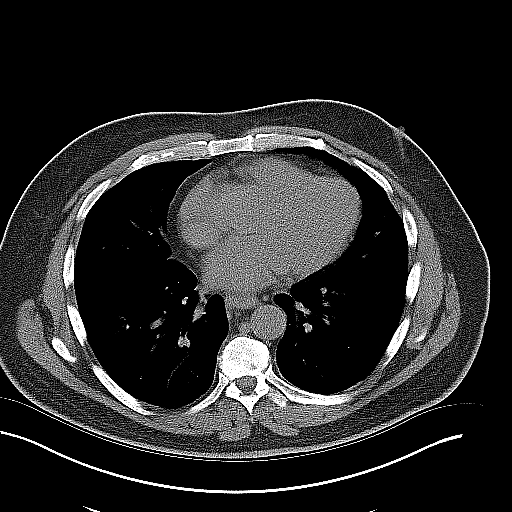
[im 29/31  soft-tissue]
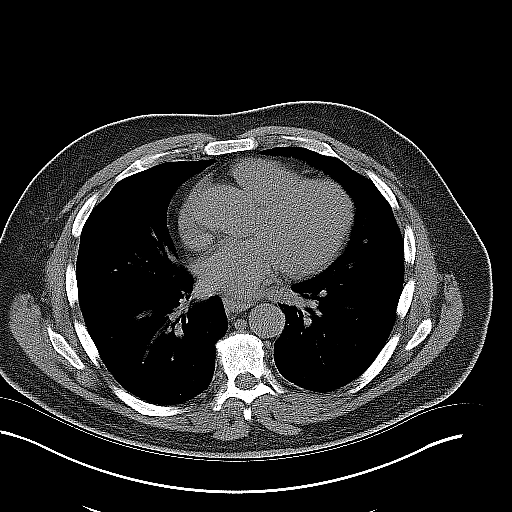

[16 of 45 positions shown; findings below may reference images not displayed]

FINDINGS: The visualized lung bases are clear.

Small calcifications are noted in the left hepatic lobe. The
gallbladder, spleen, left adrenal gland, and pancreas have an
unremarkable unenhanced appearance. A 1.2 cm low-density right
adrenal nodule is consistent with an adenoma.

There is a 2 mm obstructing calculus at the right ureterovesical
junction resulting in mild hydroureteronephrosis. There is mild
perinephric and periureteral stranding. A 1 mm punctate calculus is
present in the interpolar right kidney. There is also a 1 mm
nonobstructing calculus in the lower pole of the left kidney.
Multiple left renal sinus cysts are noted. There is mild left renal
atrophy/ scarring. No left ureteral dilatation or calculi.

There is likely a small sliding hiatal hernia. There is no evidence
of bowel obstruction. Sigmoid colon diverticulosis is noted without
evidence of diverticulitis. The appendix is unremarkable.

A retroaortic left renal vein is incidentally noted. There is mild
aortic atherosclerotic calcification. The bladder is largely
decompressed though with possible bladder wall thickening. Prostatic
calcifications are noted without significant prostate enlargement.
No free fluid or enlarged lymph nodes are identified. Thoracolumbar
spondylosis is noted.
IMPRESSION: 1. Obstructing 2 mm right UVJ calculus with mild
hydroureteronephrosis.
2. Punctate nonobstructing renal calculi bilaterally.
3. Circumferential bladder wall thickening versus underdistention.
These results will be called to the ordering clinician or
representative by the [HOSPITAL] at the imaging location.

## 2016-08-06 DIAGNOSIS — Z7982 Long term (current) use of aspirin: Secondary | ICD-10-CM | POA: Diagnosis not present

## 2016-08-06 DIAGNOSIS — C61 Malignant neoplasm of prostate: Secondary | ICD-10-CM | POA: Diagnosis not present

## 2016-08-06 LAB — APTT: APTT: 36 s (ref 24–36)

## 2016-08-06 LAB — COMPREHENSIVE METABOLIC PANEL
ALBUMIN: 4.2 g/dL (ref 3.5–5.0)
ALT: 16 U/L — ABNORMAL LOW (ref 17–63)
ANION GAP: 7 (ref 5–15)
AST: 20 U/L (ref 15–41)
Alkaline Phosphatase: 76 U/L (ref 38–126)
BUN: 17 mg/dL (ref 6–20)
CALCIUM: 8.7 mg/dL — AB (ref 8.9–10.3)
CO2: 27 mmol/L (ref 22–32)
CREATININE: 1.1 mg/dL (ref 0.61–1.24)
Chloride: 104 mmol/L (ref 101–111)
GFR calc non Af Amer: >60 mL/min (ref >60)
Glucose, Bld: 140 mg/dL — ABNORMAL HIGH (ref 65–99)
POTASSIUM: 4.3 mmol/L (ref 3.5–5.1)
SODIUM: 138 mmol/L (ref 135–145)
Total Bilirubin: 0.7 mg/dL (ref 0.3–1.2)
Total Protein: 7.2 g/dL (ref 6.5–8.1)

## 2016-08-06 LAB — CBC
HCT: 39.9 % (ref 39.0–52.0)
Hemoglobin: 13.8 g/dL (ref 13.0–17.0)
MCH: 29.4 pg (ref 26.0–34.0)
MCHC: 34.6 g/dL (ref 30.0–36.0)
MCV: 85.1 fL (ref 78.0–100.0)
PLATELETS: 202 10*3/uL (ref 150–400)
RBC: 4.69 MIL/uL (ref 4.22–5.81)
RDW: 12.8 % (ref 11.5–15.5)
WBC: 4.9 10*3/uL (ref 4.0–10.5)

## 2016-08-06 LAB — PROTIME-INR
INR: 0.99
PROTHROMBIN TIME: 13.1 s (ref 11.4–15.2)

## 2016-08-11 ENCOUNTER — Encounter (HOSPITAL_BASED_OUTPATIENT_CLINIC_OR_DEPARTMENT_OTHER): Payer: Self-pay | Admitting: *Deleted

## 2016-08-12 ENCOUNTER — Encounter (HOSPITAL_BASED_OUTPATIENT_CLINIC_OR_DEPARTMENT_OTHER): Payer: Self-pay | Admitting: *Deleted

## 2016-08-12 NOTE — Progress Notes (Signed)
NPO AFTER MN W/ EXCEPTION CLEAR LIQUIDS UNTIL 0800 (NO CREAM/ MILK PRODUCTS).  ARRIVE AT 1230.  CURRENT LAB RESULTS, EKG, AND CXR IN CHART AND EPIC.  WILL DO FLEET ENEMA AM DOS.

## 2016-08-13 DIAGNOSIS — C61 Malignant neoplasm of prostate: Secondary | ICD-10-CM | POA: Diagnosis not present

## 2016-08-14 ENCOUNTER — Telehealth: Payer: Self-pay | Admitting: *Deleted

## 2016-08-14 NOTE — Telephone Encounter (Signed)
CALLED PATIENT TO REMIND OF PROCEDURE FOR 08-17-16, LVM FOR A RETURN CALL

## 2016-08-17 ENCOUNTER — Ambulatory Visit (HOSPITAL_BASED_OUTPATIENT_CLINIC_OR_DEPARTMENT_OTHER): Payer: Medicare Other | Admitting: Anesthesiology

## 2016-08-17 ENCOUNTER — Encounter (HOSPITAL_BASED_OUTPATIENT_CLINIC_OR_DEPARTMENT_OTHER)
Admission: RE | Disposition: A | Discharge status: Home / Self Care | Payer: Self-pay | Source: Ambulatory Visit | Attending: Urology

## 2016-08-17 ENCOUNTER — Ambulatory Visit (HOSPITAL_BASED_OUTPATIENT_CLINIC_OR_DEPARTMENT_OTHER)
Admission: RE | Admit: 2016-08-17 | Discharge: 2016-08-17 | Disposition: A | Discharge status: Home / Self Care | Payer: Medicare Other | Source: Ambulatory Visit | Attending: Urology | Admitting: Urology

## 2016-08-17 ENCOUNTER — Encounter (HOSPITAL_BASED_OUTPATIENT_CLINIC_OR_DEPARTMENT_OTHER): Payer: Self-pay | Admitting: *Deleted

## 2016-08-17 ENCOUNTER — Ambulatory Visit (HOSPITAL_COMMUNITY): Payer: Medicare Other

## 2016-08-17 DIAGNOSIS — Z7982 Long term (current) use of aspirin: Secondary | ICD-10-CM | POA: Insufficient documentation

## 2016-08-17 DIAGNOSIS — Z01818 Encounter for other preprocedural examination: Secondary | ICD-10-CM

## 2016-08-17 DIAGNOSIS — C61 Malignant neoplasm of prostate: Secondary | ICD-10-CM | POA: Diagnosis not present

## 2016-08-17 HISTORY — DX: Family history of malignant neoplasm of digestive organs: Z80.0

## 2016-08-17 HISTORY — PX: CYSTOSCOPY: SHX5120

## 2016-08-17 HISTORY — DX: Frequency of micturition: R35.0

## 2016-08-17 HISTORY — DX: Unspecified osteoarthritis, unspecified site: M19.90

## 2016-08-17 HISTORY — PX: RADIOACTIVE SEED IMPLANT: SHX5150

## 2016-08-17 HISTORY — DX: Personal history of urinary calculi: Z87.442

## 2016-08-17 HISTORY — DX: Prediabetes: R73.03

## 2016-08-17 HISTORY — DX: Personal history of other specified conditions: Z87.898

## 2016-08-17 LAB — GLUCOSE, CAPILLARY: Glucose-Capillary: 107 mg/dL — ABNORMAL HIGH (ref 65–99)

## 2016-08-17 SURGERY — INSERTION, RADIATION SOURCE, PROSTATE
Anesthesia: General | Site: Perineum

## 2016-08-17 MED ORDER — MIDAZOLAM HCL 2 MG/2ML IJ SOLN
INTRAMUSCULAR | Status: AC
  Filled 2016-08-17: qty 2

## 2016-08-17 MED ORDER — OXYCODONE HCL 5 MG/5ML PO SOLN
5.0000 mg | Freq: Once | ORAL | Status: DC | PRN
  Filled 2016-08-17: qty 5

## 2016-08-17 MED ORDER — HYDROMORPHONE HCL 1 MG/ML IJ SOLN
0.2500 mg | INTRAMUSCULAR | Status: DC | PRN
  Filled 2016-08-17: qty 0.5

## 2016-08-17 MED ORDER — OXYCODONE HCL 5 MG PO TABS
5.0000 mg | ORAL_TABLET | Freq: Once | ORAL | Status: DC | PRN
  Filled 2016-08-17: qty 1

## 2016-08-17 MED ORDER — PROPOFOL 10 MG/ML IV BOLUS
INTRAVENOUS | Status: DC | PRN
  Administered 2016-08-17: 150 mg via INTRAVENOUS
  Administered 2016-08-17: 20 mg via INTRAVENOUS
  Administered 2016-08-17: 50 mg via INTRAVENOUS

## 2016-08-17 MED ORDER — IOHEXOL 300 MG/ML  SOLN
INTRAMUSCULAR | Status: DC | PRN
  Administered 2016-08-17: 7 mL

## 2016-08-17 MED ORDER — DEXAMETHASONE SODIUM PHOSPHATE 10 MG/ML IJ SOLN
INTRAMUSCULAR | Status: AC
  Filled 2016-08-17: qty 1

## 2016-08-17 MED ORDER — EPHEDRINE SULFATE-NACL 50-0.9 MG/10ML-% IV SOSY
PREFILLED_SYRINGE | INTRAVENOUS | Status: DC | PRN
  Administered 2016-08-17 (×3): 10 mg via INTRAVENOUS

## 2016-08-17 MED ORDER — DEXAMETHASONE SODIUM PHOSPHATE 4 MG/ML IJ SOLN
INTRAMUSCULAR | Status: DC | PRN
  Administered 2016-08-17: 10 mg via INTRAVENOUS

## 2016-08-17 MED ORDER — KETOROLAC TROMETHAMINE 30 MG/ML IJ SOLN
INTRAMUSCULAR | Status: DC | PRN
  Administered 2016-08-17: 30 mg via INTRAVENOUS

## 2016-08-17 MED ORDER — OXYCODONE HCL 5 MG PO TABS: 5.0000 mg | ORAL_TABLET | Freq: Once | ORAL | 0 refills | Status: DC | PRN

## 2016-08-17 MED ORDER — STERILE WATER FOR IRRIGATION IR SOLN
Status: DC | PRN
  Administered 2016-08-17: 3000 mL

## 2016-08-17 MED ORDER — CEFAZOLIN SODIUM-DEXTROSE 2-4 GM/100ML-% IV SOLN
2.0000 g | Freq: Once | INTRAVENOUS | Status: DC
  Filled 2016-08-17: qty 100

## 2016-08-17 MED ORDER — MIDAZOLAM HCL 5 MG/5ML IJ SOLN
INTRAMUSCULAR | Status: DC | PRN
  Administered 2016-08-17: 2 mg via INTRAVENOUS

## 2016-08-17 MED ORDER — PROMETHAZINE HCL 25 MG/ML IJ SOLN
6.2500 mg | INTRAMUSCULAR | Status: DC | PRN
  Filled 2016-08-17: qty 1

## 2016-08-17 MED ORDER — PROPOFOL 10 MG/ML IV BOLUS
INTRAVENOUS | Status: AC
  Filled 2016-08-17: qty 20

## 2016-08-17 MED ORDER — CEFAZOLIN SODIUM-DEXTROSE 2-4 GM/100ML-% IV SOLN
2.0000 g | Freq: Once | INTRAVENOUS | Status: AC
  Administered 2016-08-17: 2 g via INTRAVENOUS
  Filled 2016-08-17: qty 100

## 2016-08-17 MED ORDER — LIDOCAINE 2% (20 MG/ML) 5 ML SYRINGE
INTRAMUSCULAR | Status: DC | PRN
  Administered 2016-08-17: 60 mg via INTRAVENOUS

## 2016-08-17 MED ORDER — ONDANSETRON HCL 4 MG/2ML IJ SOLN
INTRAMUSCULAR | Status: DC | PRN
  Administered 2016-08-17: 4 mg via INTRAVENOUS

## 2016-08-17 MED ORDER — CEFAZOLIN SODIUM-DEXTROSE 2-4 GM/100ML-% IV SOLN
INTRAVENOUS | Status: AC
  Filled 2016-08-17: qty 100

## 2016-08-17 MED ORDER — FENTANYL CITRATE (PF) 100 MCG/2ML IJ SOLN
INTRAMUSCULAR | Status: DC | PRN
  Administered 2016-08-17 (×2): 25 ug via INTRAVENOUS
  Administered 2016-08-17: 50 ug via INTRAVENOUS

## 2016-08-17 MED ORDER — FLEET ENEMA 7-19 GM/118ML RE ENEM
1.0000 | ENEMA | Freq: Once | RECTAL | Status: DC
  Filled 2016-08-17: qty 1

## 2016-08-17 MED ORDER — KETOROLAC TROMETHAMINE 30 MG/ML IJ SOLN
INTRAMUSCULAR | Status: AC
  Filled 2016-08-17: qty 1

## 2016-08-17 MED ORDER — EPHEDRINE 5 MG/ML INJ
INTRAVENOUS | Status: AC
  Filled 2016-08-17: qty 10

## 2016-08-17 MED ORDER — LACTATED RINGERS IV SOLN
INTRAVENOUS | Status: DC
  Administered 2016-08-17: 13:00:00 via INTRAVENOUS
  Filled 2016-08-17: qty 1000

## 2016-08-17 MED ORDER — CEFAZOLIN SODIUM-DEXTROSE 1-4 GM/50ML-% IV SOLN: 1.0000 g | Freq: Three times a day (TID) | INTRAVENOUS | Status: DC

## 2016-08-17 MED ORDER — PROPOFOL 10 MG/ML IV BOLUS
INTRAVENOUS | Status: AC
Start: 2016-08-17 — End: 2016-08-17
  Filled 2016-08-17: qty 20

## 2016-08-17 MED ORDER — ONDANSETRON HCL 4 MG/2ML IJ SOLN
INTRAMUSCULAR | Status: AC
  Filled 2016-08-17: qty 2

## 2016-08-17 MED ORDER — FENTANYL CITRATE (PF) 100 MCG/2ML IJ SOLN
INTRAMUSCULAR | Status: AC
  Filled 2016-08-17: qty 2

## 2016-08-17 MED ORDER — MEPERIDINE HCL 25 MG/ML IJ SOLN
6.2500 mg | INTRAMUSCULAR | Status: DC | PRN
  Filled 2016-08-17: qty 1

## 2016-08-17 SURGICAL SUPPLY — 31 items
BAG URINE DRAINAGE (UROLOGICAL SUPPLIES) ×4 IMPLANT
BLADE CLIPPER SURG (BLADE) ×4 IMPLANT
CATH FOLEY 2WAY SLVR  5CC 16FR (CATHETERS) ×2
CATH FOLEY 2WAY SLVR 5CC 16FR (CATHETERS) ×2 IMPLANT
CATH ROBINSON RED A/P 20FR (CATHETERS) ×4 IMPLANT
CLOTH BEACON ORANGE TIMEOUT ST (SAFETY) ×4 IMPLANT
COVER BACK TABLE 60X90IN (DRAPES) ×4 IMPLANT
COVER MAYO STAND STRL (DRAPES) ×4 IMPLANT
DRSG TEGADERM 4X4.75 (GAUZE/BANDAGES/DRESSINGS) ×4 IMPLANT
DRSG TEGADERM 8X12 (GAUZE/BANDAGES/DRESSINGS) ×4 IMPLANT
GAUZE SPONGE 4X4 12PLY STRL LF (GAUZE/BANDAGES/DRESSINGS) ×4 IMPLANT
GLOVE BIO SURGEON STRL SZ7.5 (GLOVE) IMPLANT
GLOVE BIO SURGEON STRL SZ8 (GLOVE) ×8 IMPLANT
GLOVE ECLIPSE 8.0 STRL XLNG CF (GLOVE) IMPLANT
GOWN STRL REUS W/ TWL LRG LVL3 (GOWN DISPOSABLE) ×2 IMPLANT
GOWN STRL REUS W/ TWL XL LVL3 (GOWN DISPOSABLE) ×2 IMPLANT
GOWN STRL REUS W/TWL LRG LVL3 (GOWN DISPOSABLE) ×2
GOWN STRL REUS W/TWL XL LVL3 (GOWN DISPOSABLE) ×2
HOLDER FOLEY CATH W/STRAP (MISCELLANEOUS) ×4 IMPLANT
IV NS 1000ML (IV SOLUTION) ×2
IV NS 1000ML BAXH (IV SOLUTION) ×2 IMPLANT
KIT RM TURNOVER CYSTO AR (KITS) ×4 IMPLANT
MANIFOLD NEPTUNE II (INSTRUMENTS) IMPLANT
PACK CYSTO (CUSTOM PROCEDURE TRAY) ×4 IMPLANT
SYRINGE 10CC LL (SYRINGE) ×4 IMPLANT
TUBE CONNECTING 12'X1/4 (SUCTIONS)
TUBE CONNECTING 12X1/4 (SUCTIONS) IMPLANT
UNDERPAD 30X30 INCONTINENT (UNDERPADS AND DIAPERS) ×8 IMPLANT
WATER STERILE IRR 3000ML UROMA (IV SOLUTION) ×4 IMPLANT
WATER STERILE IRR 500ML POUR (IV SOLUTION) ×4 IMPLANT
selectSeed I-125 ×4 IMPLANT

## 2016-08-17 NOTE — Op Note (Signed)
PRE-OPERATIVE DIAGNOSIS:  Adenocarcinoma of the prostate  POST-OPERATIVE DIAGNOSIS:  Same  PROCEDURE:  Procedure(s): 1. I-125 radioactive seed implantation 2. Cystoscopy  SURGEON:  Surgeon(s): Nicolette Bang, MD  Radiation oncologist: Dr. Tyler Pita  ANESTHESIA:  General  EBL:  Minimal  DRAINS: 14 French Foley catheter  INDICATION: Calvin Salazar is a 66 year old with a history of T1c prostate cancer. After discussing treatment options he has elected to proceed with brachytherapy  Description of procedure: After informed consent the patient was brought to the major OR, placed on the table and administered general anesthesia. He was then moved to the modified lithotomy position with his perineum perpendicular to the floor. His perineum and genitalia were then sterilely prepped. An official timeout was then performed. A 16 French Foley catheter was then placed in the bladder and filled with dilute contrast, a rectal tube was placed in the rectum and the transrectal ultrasound probe was placed in the rectum and affixed to the stand. He was then sterilely draped.  Real time ultrasonography was used along with the seed planning software [employer] Pro[employer] vs. 4.2.21. This was used to develop the seed plan including the number of needles as well as number of seeds required for complete and adequate coverage. Real-time ultrasonography was then used along with the previously developed plan and the Nucletron device to implant a total of 76 seeds using 19 needles. This proceeded without difficulty or complication.  A Foley catheter was then removed as well as the transrectal ultrasound probe and rectal probe. Flexible cystoscopy was then performed using the 17 French flexible scope which revealed a normal urethra throughout its length down to the sphincter which appeared intact. The prostatic urethra revealed bilobar hypertrophy but no evidence of obstruction, seeds, spacers or lesions. The  bladder was then entered and fully and systematically inspected. The ureteral orifices were noted to be of normal configuration and position. The mucosa revealed no evidence of tumors. There were also no stones identified within the bladder. I noted no seeds or spacers on the floor of the bladder and retroflexion of the scope revealed no seeds protruding from the base of the prostate.  The cystoscope was then removed and a new 42 French Foley catheter was then inserted and the balloon was filled with 10 cc of sterile water. This was connected to closed system drainage and the patient was awakened and taken to recovery room in stable and satisfactory condition. He tolerated procedure well and there were no intraoperative complications.

## 2016-08-17 NOTE — Anesthesia Procedure Notes (Signed)
Procedure Name: LMA Insertion Date/Time: 08/17/2016 2:12 PM Performed by: Candida Peeling RAY Pre-anesthesia Checklist: Patient identified, Emergency Drugs available, Suction available and Patient being monitored Patient Re-evaluated:Patient Re-evaluated prior to inductionOxygen Delivery Method: Circle system utilized Preoxygenation: Pre-oxygenation with 100% oxygen Intubation Type: IV induction Ventilation: Mask ventilation without difficulty LMA: LMA inserted LMA Size: 5.0 Number of attempts: 1 Airway Equipment and Method: Bite block Placement Confirmation: positive ETCO2 Tube secured with: Tape Dental Injury: Teeth and Oropharynx as per pre-operative assessment

## 2016-08-17 NOTE — Progress Notes (Signed)
  Radiation Oncology         (336) [phone] ________________________________  Name: Calvin Salazar MRN: [id number]  Date: 08/17/2016  DOB: Jun 25, 1950       Prostate Seed Implant  [id number], Gwyndolyn Saxon, MD  No ref. provider found  DIAGNOSIS: 66 year-old gentleman with Stage T1c adenocarcinoma of the prostate with Gleason Score of 3+3, and PSA of 6.9    ICD-9-CM ICD-10-CM   1. Pre-op testing V72.84 Z01.818 DG Chest 2 View     DG Chest 2 View    PROCEDURE: Insertion of radioactive I-125 seeds into the prostate gland.  RADIATION DOSE: 145 Gy, definitive therapy.  TECHNIQUE: Calvin Salazar was brought to the operating room with the urologist. He was placed in the dorsolithotomy position. He was catheterized and a rectal tube was inserted. The perineum was shaved, prepped and draped. The ultrasound probe was then introduced into the rectum to see the prostate gland.  TREATMENT DEVICE: A needle grid was attached to the ultrasound probe stand and anchor needles were placed.  3D PLANNING: The prostate was imaged in 3D using a sagittal sweep of the prostate probe. These images were transferred to the planning computer. There, the prostate, urethra and rectum were defined on each axial reconstructed image. Then, the software created an optimized 3D plan and a few seed positions were adjusted. The quality of the plan was reviewed using [facility] information for the target and the following two organs at risk:  Urethra and Rectum.  Then the accepted plan was uploaded to the seed Selectron afterloading unit.  PROSTATE VOLUME STUDY:  Using transrectal ultrasound the volume of the prostate was verified to be 38.6 cc.  SPECIAL TREATMENT PROCEDURE/SUPERVISION AND HANDLING: The Nucletron FIRST system was used to place the needles under sagittal guidance. A total of 19 needles were used to deposit 76 seeds in the prostate gland. The individual seed activity was 0.404 mCi.  SpaceOAR was placed under TRUS  guidance.  COMPLEX SIMULATION: At the end of the procedure, an anterior radiograph of the pelvis was obtained to document seed positioning and count. Cystoscopy was performed to check the urethra and bladder.  MICRODOSIMETRY: At the end of the procedure, the patient was emitting 0.015 mR/hr at 1 meter. Accordingly, he was considered safe for hospital discharge.  PLAN: The patient will return to the radiation oncology clinic for post implant CT dosimetry in three weeks.   ________________________________  Sheral Apley Tammi Klippel, M.D.

## 2016-08-17 NOTE — Anesthesia Preprocedure Evaluation (Addendum)
Anesthesia Evaluation  Patient identified by MRN, date of birth, ID band Patient awake    Reviewed: Allergy & Precautions, NPO status , Patient's Chart, lab work & pertinent test results  Airway Mallampati: II  TM Distance: >3 FB Neck ROM: Full    Dental no notable dental hx. (+) Edentulous Upper, Upper Dentures   Pulmonary neg pulmonary ROS,    Pulmonary exam normal breath sounds clear to auscultation       Cardiovascular negative cardio ROS Normal cardiovascular exam Rhythm:Regular Rate:Normal     Neuro/Psych negative neurological ROS  negative psych ROS   GI/Hepatic negative GI ROS, Neg liver ROS,   Endo/Other  negative endocrine ROS  Renal/GU negative Renal ROS   Prostate Cancer    Musculoskeletal negative musculoskeletal ROS (+) Arthritis ,   Abdominal   Peds negative pediatric ROS (+)  Hematology negative hematology ROS (+)   Anesthesia Other Findings   Reproductive/Obstetrics negative OB ROS                            Anesthesia Physical Anesthesia Plan  ASA: II  Anesthesia Plan: General   Post-op Pain Management:    Induction: Intravenous  Airway Management Planned: LMA  Additional Equipment:   Intra-op Plan:   Post-operative Plan: Extubation in OR  Informed Consent: I have reviewed the patients History and Physical, chart, labs and discussed the procedure including the risks, benefits and alternatives for the proposed anesthesia with the patient or authorized representative who has indicated his/her understanding and acceptance.   Dental advisory given  Plan Discussed with: CRNA  Anesthesia Plan Comments:         Anesthesia Quick Evaluation

## 2016-08-17 NOTE — Discharge Instructions (Signed)
Post Anesthesia Home Care Instructions  Activity: Get plenty of rest for the remainder of the day. A responsible individual must stay with you for 24 hours following the procedure.  For the next 24 hours, DO NOT: -Drive a car -Paediatric nurse -Drink alcoholic beverages -Take any medication unless instructed by your physician -Make any legal decisions or sign important papers.  Meals: Start with liquid foods such as gelatin or soup. Progress to regular foods as tolerated. Avoid greasy, spicy, heavy foods. If nausea and/or vomiting occur, drink only clear liquids until the nausea and/or vomiting subsides. Call your physician if vomiting continues.  Special Instructions/Symptoms: Your throat may feel dry or sore from the anesthesia or the breathing tube placed in your throat during surgery. If this causes discomfort, gargle with warm salt water. The discomfort should disappear within 24 hours.  If you had a scopolamine patch placed behind your ear for the management of post- operative nausea and/or vomiting:  1. The medication in the patch is effective for 72 hours, after which it should be removed.  Wrap patch in a tissue and discard in the trash. Wash hands thoroughly with soap and water. 2. You may remove the patch earlier than 72 hours if you experience unpleasant side effects which may include dry mouth, dizziness or visual disturbances. 3. Avoid touching the patch. Wash your hands with soap and water after contact with the patch.   Radioactive Seed Implant Home Care Instructions   Activity:    Rest for the remainder of the day.  Do not drive or operate equipment today.  You may resume normal  activities in a few days as instructed by your physician, without risk of harmful radiation exposure to those around you, provided you follow the time and distance precautions on the Radiation Oncology Instruction Sheet.   Meals: Drink plenty of lipuids and eat light foods, such as gelatin or  soup this evening .  You may return to normal meal plan tomorrow.  Return To Work: You may return to work as instructed by [employer].  Special Instruction:   If any seeds are found, use tweezers to pick up seeds and place in a glass container of any kind and bring to your physician's office.  Call your physician if any of these symptoms occur:   Persistent or heavy bleeding  Urine stream diminishes or stops completely after catheter is removed  Fever equal to or greater than 101 degrees F  Cloudy urine with a strong foul odor  Severe pain  You may feel some burning pain and/or hesitancy when you urinate after the catheter is removed.  These symptoms may increase over the next few weeks, but should diminish within forur to six weeks.  Applying moist heat to the lower abdomen or a hot tub bath may help relieve the pain.  If the discomfort becomes severe, please call your physician for additional medications.  Follow-up (Date of Return Visit to Physician):  Patient:_______________________________   @DATE @  Nurse:________________________________ @DATE @  Brachytherapy for Prostate Cancer, Care After Refer to this sheet in the next few weeks. These instructions provide you with information on caring for yourself after your procedure. Your health care provider may also give you more specific instructions. Your treatment has been planned according to current medical practices, but problems sometimes occur. Call your health care provider if you have any problems or questions after your procedure. What can I expect after the procedure? The area behind the scrotum will probably be tender  and bruised. For a short period of time you may have:  Difficulty passing urine. You may need a catheter for a few days to a month.  Blood in the urine or semen.  A feeling of constipation because of prostate swelling.  Frequent feeling of an urgent need to urinate. For a long period of time you may  have:  Inflammation of the rectum. This happens in about 2% of people who have the procedure.  Erection problems. These vary with age and occur in about 15-40% of men.  Difficulty urinating. This is caused by scarring in the urethra.  Diarrhea. Follow these instructions at home:  Take medicines only as directed by your health care provider.  You will probably have a catheter in your bladder for several days. You will have blood in the urine bag and should drink a lot of fluids to keep it a light red color.  Keep all follow-up visits as directed by your health care provider. If you have a catheter, it will be removed during one of these visits.  Try not to sit directly on the area behind the scrotum. A soft cushion can decrease the discomfort. Ice packs may also be helpful for the discomfort. Do not put ice directly on the skin.  Shower and wash the area behind the scrotum gently. Do not sit in a tub.  If you have had the brachytherapy that uses the seeds, limit your close contact with children and pregnant women for 2 months because of the radiation still in the prostate. After that period of time, the levels drop off quickly. Get help right away if:  You have a fever.  You have chills.  You have shortness of breath.  You have chest pain.  You have thick blood, like tomato juice, in the urine bag.  Your catheter is blocked so urine cannot get into the bag. Your bladder area or lower abdomen may be swollen.  There is excessive bleeding from your rectum. It is normal to have a little blood mixed with your stool.  There is severe discomfort in the treated area that does not go away with pain medicine.  You have abdominal discomfort.  You have severe nausea or vomiting.  You develop any new or unusual symptoms. This information is not intended to replace advice given to you by your health care provider. Make sure you discuss any questions you have with your health care  provider. Document Released: 05/16/2010 Document Revised: 09/25/2015 Document Reviewed: 10/04/2012 Elsevier Interactive Patient Education  2017 [employer].

## 2016-08-17 NOTE — Transfer of Care (Signed)
  Last Vitals:  Vitals:   08/17/16 1238 08/17/16 1540  BP: 136/78 122/70  Pulse: 89 81  Resp: 16 (!) 9  Temp: 37.1 C 36.4 C    Last Pain:  Vitals:   08/17/16 1238  TempSrc: Oral      Patients Stated Pain Goal: 8 (08/17/16 1252)  Immediate Anesthesia Transfer of Care Note  Patient: Casandra Doffing  Procedure(s) Performed: Procedure(s) (LRB): RADIOACTIVE SEED IMPLANT/BRACHYTHERAPY IMPLANT WITH SPACEOAR (N/A) CYSTOSCOPY FLEXIBLE  Patient Location: PACU  Anesthesia Type: General  Level of Consciousness: awake, alert  and oriented  Airway & Oxygen Therapy: Patient Spontanous Breathing and Patient connected to nasal cannula oxygen  Post-op Assessment: Report given to PACU RN and Post -op Vital signs reviewed and stable  Post vital signs: Reviewed and stable  Complications: No apparent anesthesia complications

## 2016-08-17 NOTE — H&P (Signed)
Urology Admission H&P  Chief Complaint: prostate cancer  History of Present Illness: Mr Lammert is a 66yo with T1c prostate cancer here for brachytherapy  Past Medical History:  Diagnosis Date  . Arthritis   . Family history of colon cancer   . Frequency of urination   . History of kidney stones   . History of urinary retention    03/ 2017 due to kidney stone obstruction  . Hx of adenomatous colonic polyps 03/11/2015   tubular adenoma's  . Pre-diabetes   . Prostate cancer ([place]) UROLOGIST- DR Jora Galluzzo/  ONCOLOGIST-- DR MANNING   dx 01/ 2018,  Stage T1c,  Gleason 3+3, PSA 6.9, vol 31.7cc   Past Surgical History:  Procedure Laterality Date  . COLONOSCOPY  last one 03-11-2015  . PROSTATE BIOPSY  05/05/2016    Home Medications:  Prescriptions Prior to Admission  Medication Sig Dispense Refill Last Dose  . aspirin EC 81 MG tablet Take 81 mg by mouth daily.   Past Week at Unknown time  . CINNAMON PO Take 1 capsule by mouth daily.    Past Month at Unknown time   Allergies: No Known Allergies  Family History  Problem Relation Age of Onset  . Colon cancer Mother 48    intestines  . Colon cancer Father 18  . Diabetes Maternal Grandmother    Social History:  reports that he has never smoked. He has never used smokeless tobacco. He reports that he drinks alcohol. He reports that he does not use drugs.  Review of Systems  All other systems reviewed and are negative.   Physical Exam:  Vital signs in last 24 hours: Temp:  [98.7 F (37.1 C)] 98.7 F (37.1 C) (04/23 1238) Pulse Rate:  [89] 89 (04/23 1238) Resp:  [16] 16 (04/23 1238) BP: (136)/(78) 136/78 (04/23 1238) SpO2:  [97 %] 97 % (04/23 1238) Weight:  [106.1 kg (234 lb)] 106.1 kg (234 lb) (04/23 1238) Physical Exam  Constitutional: He is oriented to person, place, and time. He appears well-developed and well-nourished.  HENT:  Head: Normocephalic and atraumatic.  Eyes: EOM are normal. Pupils are equal, round, and  reactive to light.  Neck: Normal range of motion. No thyromegaly present.  Cardiovascular: Normal rate and regular rhythm.   Respiratory: Effort normal. No respiratory distress.  GI: Soft. He exhibits no distension.  Musculoskeletal: Normal range of motion. He exhibits no edema.  Neurological: He is alert and oriented to person, place, and time.  Skin: Skin is warm and dry.  Psychiatric: He has a normal mood and affect. His behavior is normal. Judgment and thought content normal.    Laboratory Data:  Results for orders placed or performed during the hospital encounter of 08/17/16 (from the past 24 hour(s))  Glucose, capillary     Status: Abnormal   Collection Time: 08/17/16  1:38 PM  Result Value Ref Range   Glucose-Capillary 107 (H) 65 - 99 mg/dL   No results found for this or any previous visit (from the past 240 hour(s)). Creatinine: No results for input(s): CREATININE in the last 168 hours. Baseline Creatinine: unknwon  Impression/Assessment:  65yo with prostate cancer  Plan:  The risks/benefits/alternatives to prostate brachytherapy was explained to the patient and he understands and wishes to proceed with surgery  Nicolette Bang 08/17/2016, 2:01 PM

## 2016-08-18 ENCOUNTER — Encounter (HOSPITAL_BASED_OUTPATIENT_CLINIC_OR_DEPARTMENT_OTHER): Payer: Self-pay | Admitting: Urology

## 2016-08-18 NOTE — Anesthesia Postprocedure Evaluation (Signed)
Anesthesia Post Note  Patient: CRAIGORY TOSTE  Procedure(s) Performed: Procedure(s) (LRB): RADIOACTIVE SEED IMPLANT/BRACHYTHERAPY IMPLANT WITH SPACEOAR (N/A) CYSTOSCOPY FLEXIBLE  Patient location during evaluation: PACU Anesthesia Type: General Level of consciousness: awake and alert Pain management: pain level controlled Vital Signs Assessment: post-procedure vital signs reviewed and stable Respiratory status: spontaneous breathing, nonlabored ventilation, respiratory function stable and patient connected to nasal cannula oxygen Cardiovascular status: blood pressure returned to baseline and stable Postop Assessment: no signs of nausea or vomiting Anesthetic complications: no       Last Vitals:  Vitals:   08/17/16 1615 08/17/16 1715  BP: 123/75 113/69  Pulse: 70 61  Resp: 17 16  Temp:  36.7 C    Last Pain:  Vitals:   08/17/16 1238  TempSrc: Oral                 Dainel Arcidiacono S

## 2016-09-10 ENCOUNTER — Telehealth: Payer: Self-pay | Admitting: *Deleted

## 2016-09-10 NOTE — Telephone Encounter (Signed)
CALLED PATIENT TO REMIND OF POST SEED APPTS. FOR 09-11-16, LVM FOR A RETURN CALL

## 2016-09-11 ENCOUNTER — Ambulatory Visit
Admission: RE | Admit: 2016-09-11 | Discharge: 2016-09-11 | Disposition: A | Discharge status: Home / Self Care | Payer: Medicare Other | Source: Ambulatory Visit | Attending: Radiation Oncology | Admitting: Radiation Oncology

## 2016-09-11 VITALS — BP 126/78 | HR 73 | Temp 98.3°F

## 2016-09-11 DIAGNOSIS — Z6837 Body mass index (BMI) 37.0-37.9, adult: Secondary | ICD-10-CM | POA: Insufficient documentation

## 2016-09-11 DIAGNOSIS — Z800 Family history of malignant neoplasm of digestive organs: Secondary | ICD-10-CM | POA: Insufficient documentation

## 2016-09-11 DIAGNOSIS — Z9889 Other specified postprocedural states: Secondary | ICD-10-CM | POA: Diagnosis not present

## 2016-09-11 DIAGNOSIS — Z833 Family history of diabetes mellitus: Secondary | ICD-10-CM | POA: Insufficient documentation

## 2016-09-11 DIAGNOSIS — Z86010 Personal history of colonic polyps: Secondary | ICD-10-CM | POA: Insufficient documentation

## 2016-09-11 DIAGNOSIS — Z7982 Long term (current) use of aspirin: Secondary | ICD-10-CM | POA: Diagnosis not present

## 2016-09-11 DIAGNOSIS — E119 Type 2 diabetes mellitus without complications: Secondary | ICD-10-CM | POA: Diagnosis not present

## 2016-09-11 DIAGNOSIS — E669 Obesity, unspecified: Secondary | ICD-10-CM | POA: Insufficient documentation

## 2016-09-11 DIAGNOSIS — C61 Malignant neoplasm of prostate: Secondary | ICD-10-CM

## 2016-09-11 NOTE — Addendum Note (Signed)
Encounter addended by: Heywood Footman, RN on: 09/11/2016 12:35 PM<BR>    Actions taken: Sign clinical note, Charge Capture section accepted

## 2016-09-11 NOTE — Progress Notes (Signed)
Radiation Oncology         [phone]) [phone] ________________________________  Name: Calvin Salazar MRN: [id number]  Date: 09/11/2016  DOB: 03-Jun-1950     Follow-Up Post-Seed  CC: Marton Redwood, MD  Cleon Gustin, MD  Diagnosis:   Low risk, Stage T1c adenocarcinoma of the prostate with Gleason Score of 3+3, and PSA of 6.9    ICD-9-CM ICD-10-CM   1. Malignant neoplasm of prostate ([place]) 185 C61      Narrative:  The patient returns today for follow up since undergoing radioactive seed implant for his prostate cancer. At the time of surgery, he received a total of 83 seeds.  He has undergone repeat pelvic CT in our department today and the initial evaluation of these images reveal successful seed implant quality. His pretreatment IPSS was reviewed and was 12 in February 2018, his reported IPSS score today is 8.                    On review of systems, the patient reports that he is doing well overall. He denies any chest pain, shortness of breath, cough, fevers, chills, night sweats, unintended weight changes. He reports that he is having the following urinary symptoms: urinary frequency, nocturia x2, weaker stream, and intermittency. He denies severe symptoms. No other complaints are noted.  ALLERGIES:  has No Known Allergies.  Meds: Current Outpatient Prescriptions  Medication Sig Dispense Refill  . aspirin EC 81 MG tablet Take 81 mg by mouth daily.    Marland Kitchen CINNAMON PO Take 1 capsule by mouth daily.     Marland Kitchen oxyCODONE (OXY IR/ROXICODONE) 5 MG immediate release tablet Take 1 tablet (5 mg total) by mouth once as needed (for pain score of 1-4). 30 tablet 0   Current Facility-Administered Medications  Medication Dose Route Frequency Provider Last Rate Last Dose  . ceFAZolin (ANCEF) IVPB 1 g/50 mL premix  1 g Intravenous Q8H McKenzie, Candee Furbish, MD        Physical Findings: Afebrile, vital sign stable and can be referenced in EPIC In general this is a well appearing caucasian male in no  acute distress. He's alert and oriented x4 and appropriate throughout the examination. Cardiopulmonary assessment is negative for acute distress and he exhibits normal effort.    Lab Findings: Lab Results  Component Value Date   WBC 4.9 08/06/2016   HGB 13.8 08/06/2016   HCT 39.9 08/06/2016   PLT 202 08/06/2016    Lab Results  Component Value Date   NA 138 08/06/2016   K 4.3 08/06/2016   CO2 27 08/06/2016   GLUCOSE 140 (H) 08/06/2016   BUN 17 08/06/2016   CREATININE 1.10 08/06/2016   BILITOT 0.7 08/06/2016   ALKPHOS 76 08/06/2016   AST 20 08/06/2016   ALT 16 (L) 08/06/2016   PROT 7.2 08/06/2016   ALBUMIN 4.2 08/06/2016   CALCIUM 8.7 (L) 08/06/2016   ANIONGAP 7 08/06/2016    Radiographic Findings: Dg C-arm 1-60 Min-no Report  Result Date: 08/17/2016 Fluoroscopy was utilized by the requesting physician.  No radiographic interpretation.    Impression/Plan: 1. Low risk, Stage T1c adenocarcinoma of the prostate with Gleason Score of 3+3, and PSA of 6.9. The patient has done well with tolerating the side effects of radioactive seed implant. We anticipate that his symptoms will continue to improve as the radioactive decay continues. We again reviewed perioperative expectations, and reviewed the rationale for returning to urology in about 3 months to begin PSA checks.  We would be happy to see him back in the future if he has questions or concerns regarding his procedure. We will also send the formal assessment of implant quality based on his CT and pre-seed placement planning to his urologist. He states agreement and understanding.     Carola Rhine, PAC

## 2016-09-11 NOTE — Progress Notes (Signed)
Patient not seen in nursing clinic.

## 2016-09-11 NOTE — Progress Notes (Signed)
  Radiation Oncology         (336) [phone] ________________________________  Name: CZAR YSAGUIRRE MRN: [id number]  Date: 09/11/2016  DOB: 1951/03/15  COMPLEX SIMULATION NOTE  NARRATIVE:  The patient was brought to the [facility] today following prostate seed implantation approximately one month ago.  Identity was confirmed.  All relevant records and images related to the planned course of therapy were reviewed.  Then, the patient was set-up supine.  CT images were obtained.  The CT images were loaded into the planning software.  Then the prostate and rectum were contoured.  Treatment planning then occurred.  The implanted iodine 125 seeds were identified by the physics staff for projection of radiation distribution  I have requested : 3D Simulation  I have requested a DVH of the following structures: Prostate and rectum.    ________________________________  Sheral Apley Tammi Klippel, M.D.  This document serves as a record of services personally performed by Tyler Pita, MD. It was created on his behalf by Darcus Austin, a trained medical scribe. The creation of this record is based on the scribe's personal observations and the provider's statements to them. This document has been checked and approved by the attending provider.

## 2016-09-24 ENCOUNTER — Encounter: Payer: Self-pay | Admitting: Radiation Oncology

## 2016-09-24 DIAGNOSIS — C61 Malignant neoplasm of prostate: Secondary | ICD-10-CM | POA: Diagnosis not present

## 2016-09-27 NOTE — Progress Notes (Signed)
  Radiation Oncology         (336) [phone] ________________________________  Name: Calvin Salazar MRN: [id number]  Date: 09/24/2016  DOB: January 13, 1951  3D Planning Note   Prostate Brachytherapy Post-Implant Dosimetry  Diagnosis:  66 y.o. gentleman with Stage T1c adenocarcinoma of the prostate with Gleason Score of 3+3, and PSA of 6.9  Narrative: On a previous date, Calvin Salazar returned following prostate seed implantation for post implant planning. He underwent CT scan complex simulation to delineate the three-dimensional structures of the pelvis and demonstrate the radiation distribution.  Since that time, the seed localization, and complex isodose planning with dose volume histograms have now been completed.  Results:   Prostate Coverage - The dose of radiation delivered to the 90% or more of the prostate gland (D90) was 119% of the prescription dose. This exceeds our goal of greater than 90%. Rectal Sparing - The volume of rectal tissue receiving the prescription dose or higher was 0.0 cc. This falls under our thresholds tolerance of 1.0 cc.  Impression: The prostate seed implant appears to show adequate target coverage and appropriate rectal sparing.  Plan:  The patient will continue to follow with urology for ongoing PSA determinations. I would anticipate a high likelihood for local tumor control with minimal risk for rectal morbidity.  ________________________________  Sheral Apley Tammi Klippel, M.D.

## 2016-09-28 NOTE — Anesthesia Postprocedure Evaluation (Signed)
Anesthesia Post Note  Patient: Calvin Salazar  Procedure(s) Performed: Procedure(s) (LRB): RADIOACTIVE SEED IMPLANT/BRACHYTHERAPY IMPLANT (N/A) CYSTOSCOPY FLEXIBLE     Anesthesia Post Evaluation  Last Vitals:  Vitals:   08/17/16 1615 08/17/16 1715  BP: 123/75 113/69  Pulse: 70 61  Resp: 17 16  Temp:  36.7 C    Last Pain:  Vitals:   08/17/16 1238  TempSrc: Oral                 [place] S

## 2016-09-28 NOTE — Addendum Note (Signed)
Addendum  created 09/28/16 1340 by Myrtie Soman, MD   Sign clinical note

## 2017-03-26 NOTE — Addendum Note (Signed)
Encounter addended by: Heywood Footman, RN on: 03/26/2017 12:13 PM  Actions taken: Flowsheet accepted

## 2018-04-15 ENCOUNTER — Encounter (INDEPENDENT_AMBULATORY_CARE_PROVIDER_SITE_OTHER): Payer: Self-pay | Admitting: Ophthalmology

## 2019-06-17 ENCOUNTER — Ambulatory Visit: Payer: Medicare Other | Attending: Internal Medicine

## 2019-06-17 DIAGNOSIS — Z23 Encounter for immunization: Secondary | ICD-10-CM | POA: Insufficient documentation

## 2019-06-17 NOTE — Progress Notes (Signed)
   Covid-19 Vaccination Clinic  Name:  Calvin Salazar    MRN: [id number] DOB: November 04, 1950  06/17/2019  [name] was observed post Covid-19 immunization for 15 minutes without incidence. He was provided with Vaccine Information Sheet and instruction to access the V-Safe system.   [name] was instructed to call 911 with any severe reactions post vaccine: Marland Kitchen Difficulty breathing  . Swelling of your face and throat  . A fast heartbeat  . A bad rash all over your body  . Dizziness and weakness    Immunizations Administered    Name Date Dose VIS Date Route   Pfizer COVID-19 Vaccine 06/17/2019  2:59 PM 0.3 mL 04/07/2019 Intramuscular   Manufacturer: [place]   Lot: [id number]   [place]: [id number]

## 2019-07-11 ENCOUNTER — Ambulatory Visit: Payer: Medicare Other | Attending: Internal Medicine

## 2019-07-11 DIAGNOSIS — Z23 Encounter for immunization: Secondary | ICD-10-CM

## 2019-07-11 NOTE — Progress Notes (Signed)
   Covid-19 Vaccination Clinic  Name:  Calvin Salazar    MRN: [id number] DOB: 1950/11/08  07/11/2019  [name] was observed post Covid-19 immunization for 15 minutes without incident. He was provided with Vaccine Information Sheet and instruction to access the V-Safe system.   [name] was instructed to call 911 with any severe reactions post vaccine: Marland Kitchen Difficulty breathing  . Swelling of face and throat  . A fast heartbeat  . A bad rash all over body  . Dizziness and weakness   Immunizations Administered    Name Date Dose VIS Date Route   Pfizer COVID-19 Vaccine 07/11/2019  1:22 PM 0.3 mL 04/07/2019 Intramuscular   Manufacturer: [place]   Lot: [id number]   [place]: [id number]

## 2019-08-14 ENCOUNTER — Other Ambulatory Visit: Payer: Self-pay

## 2019-08-14 ENCOUNTER — Ambulatory Visit: Payer: Medicare Other | Admitting: Neurology

## 2019-08-14 ENCOUNTER — Encounter: Payer: Self-pay | Admitting: Neurology

## 2019-08-14 VITALS — BP 128/82 | HR 69 | Ht 69.5 in | Wt 258.0 lb

## 2019-08-14 DIAGNOSIS — Z9189 Other specified personal risk factors, not elsewhere classified: Secondary | ICD-10-CM | POA: Diagnosis not present

## 2019-08-14 DIAGNOSIS — R413 Other amnesia: Secondary | ICD-10-CM

## 2019-08-14 DIAGNOSIS — E669 Obesity, unspecified: Secondary | ICD-10-CM | POA: Diagnosis not present

## 2019-08-14 NOTE — Progress Notes (Signed)
Subjective:    Patient ID: Calvin Salazar is a 69 y.o. male.  HPI     Star Age, MD, PhD [facility] [address], [address] [place], [place] [address]  Dear Dr. Brigitte Pulse,   I saw your patient, Calvin Salazar, upon your kind request, in my sleep clinic today for Initial consultation of his sleep disorder, in particular, concern for underlying obstructive sleep apnea.  The patient is unaccompanied today.  As you know, Mr. Calvin Salazar is a 69 year old right-handed gentleman with an underlying medical history of diabetes, prostate cancer, osteoarthritis, memory loss and obesity, who reports no significant sleep-related concerns.  He is not sure, if he snores. I reviewed your office note from 07/19/2019.  His MMSE was 28 out of 30 at the time.  His Epworth sleepiness score is 6 out of 24, fatigue severity score is 29 out of 63.  His A1c was above goal at 8.6 on 07/19/2019. He lives with his long-term girlfriend who has not complained about his snoring.  He is a retired Clinical biochemist.  He does not keep a very set schedule for his sleep time and rise time, generally in bed probably between 8 and 9 PM.  He watches TV in the bedroom and turns the TV off when he is ready to fall asleep.  He typically gets out of bed between 8 and 9.  He does not have night to night nocturia and denies any morning headaches.  He is not aware of any family history of OSA.  His weight has fluctuated a little bit, he gained about 5 pounds in the past year.  He is a non-smoker, he does not drink caffeine daily, he drinks alcohol twice a week on average.  He does not take a nap every day, but occasionally.  His Past Medical History Is Significant For: Past Medical History:  Diagnosis Date  . Arthritis   . Dyslipidemia   . Family history of colon cancer   . Frequency of urination   . History of kidney stones   . History of urinary retention    03/ 2017 due to kidney stone obstruction  . Hx of  adenomatous colonic polyps 03/11/2015   tubular adenoma's  . Pre-diabetes   . Prostate cancer ([place]) UROLOGIST- DR MCKENZIE/  ONCOLOGIST-- DR Tammi Klippel   dx 01/ 2018,  Stage T1c,  Gleason 3+3, PSA 6.9, vol 31.7cc    His Past Surgical History Is Significant For: Past Surgical History:  Procedure Laterality Date  . COLONOSCOPY  last one 03-11-2015  . CYSTOSCOPY  08/17/2016   Procedure: CYSTOSCOPY FLEXIBLE;  Surgeon: Cleon Gustin, MD;  [facility]: [facility];  Service: Urology;;  no seeds found in bladder  . PROSTATE BIOPSY  05/05/2016  . RADIOACTIVE SEED IMPLANT N/A 08/17/2016   Procedure: RADIOACTIVE SEED IMPLANT/BRACHYTHERAPY IMPLANT;  Surgeon: Cleon Gustin, MD;  Location: [facility];  Service: Urology;  Laterality: N/A;   43 seeds implanted    His Family History Is Significant For: Family History  Problem Relation Age of Onset  . Colon cancer Mother 35       intestines  . Colon cancer Father 30  . Diabetes Maternal Grandmother     His Social History Is Significant For: Social History   Socioeconomic History  . Marital status: Unknown    Spouse name: Not on file  . Number of children: Not on file  . Years of education: Not on file  .  Highest education level: Not on file  Occupational History  . Not on file  Tobacco Use  . Smoking status: Never Smoker  . Smokeless tobacco: Never Used  Substance and Sexual Activity  . Alcohol use: Yes    Alcohol/week: 0.0 standard drinks    Comment: occasionally  . Drug use: No  . Sexual activity: Not on file  Other Topics Concern  . Not on file  Social History Narrative  . Not on file   Social Determinants of Health   Financial Resource Strain:   . Difficulty of Paying Living Expenses:   Food Insecurity:   . Worried About Charity fundraiser in the Last Year:   . Arboriculturist in the Last Year:   Transportation Needs:   . Film/video editor (Medical):   Marland Kitchen Lack of Transportation  (Non-Medical):   Physical Activity:   . Days of Exercise per Week:   . Minutes of Exercise per Session:   Stress:   . Feeling of Stress :   Social Connections:   . Frequency of Communication with Friends and Family:   . Frequency of Social Gatherings with Friends and Family:   . Attends Religious Services:   . Active Member of Clubs or Organizations:   . Attends Archivist Meetings:   Marland Kitchen Marital Status:     His Allergies Are:  No Known Allergies:   His Current Medications Are:  Outpatient Encounter Medications as of 08/14/2019  Medication Sig  . aspirin EC 81 MG tablet Take 81 mg by mouth daily.  Marland Kitchen CINNAMON PO Take 1 capsule by mouth daily.   . metFORMIN (GLUCOPHAGE) 1000 MG tablet Take 1,000 mg by mouth 2 (two) times daily with a meal.  . rosuvastatin (CRESTOR) 10 MG tablet Take 10 mg by mouth daily.  . [DISCONTINUED] oxyCODONE (OXY IR/ROXICODONE) 5 MG immediate release tablet Take 1 tablet (5 mg total) by mouth once as needed (for pain score of 1-4).   Facility-Administered Encounter Medications as of 08/14/2019  Medication  . ceFAZolin (ANCEF) IVPB 1 g/50 mL premix  :  Review of Systems:  Out of a complete 14 point review of systems, all are reviewed and negative with the exception of these symptoms as listed below: Review of Systems  Neurological:       Pt presents today to discuss his sleep. Pt has never had a sleep study and does not know if he snores.  Epworth Sleepiness Scale 0= would never doze 1= slight chance of dozing 2= moderate chance of dozing 3= high chance of dozing  Sitting and reading: 2 Watching TV: 1 Sitting inactive in a public place (ex. Theater or meeting): 0 As a passenger in a car for an hour without a break: 0 Lying down to rest in the afternoon: 3 Sitting and talking to someone: 0 Sitting quietly after lunch (no alcohol): 0 In a car, while stopped in traffic: 0 Total: 6     Objective:  Neurological Exam  Physical  Exam Physical Examination:   Vitals:   08/14/19 0745  BP: 128/82  Pulse: 69    General Examination: The patient is a very pleasant 69 y.o. male in no acute distress. He appears well-developed and well-nourished and well groomed.   HEENT: Normocephalic, atraumatic, pupils are equal, round and reactive to light, extraocular tracking is preserved, no obv. nystagmus noted. Hearing is grossly intact. Face is symmetric with normal facial animation. Speech is clear with no dysarthria noted. There  is no hypophonia. There is no lip, neck/head, jaw or voice tremor. Neck is supple with full range of passive and active motion. There are no carotid bruits on auscultation. Oropharynx exam reveals: mild mouth dryness, adequate dental hygiene and moderate airway crowding, due to redundant soft palate, tonsils in place, about 1+, right side bigger than L. Mallampati is class II. Tongue protrudes centrally and palate elevates symmetrically. Neck of 19 in. No overbite.   Chest: Clear to auscultation without wheezing, rhonchi or crackles noted.  Heart: S1+S2+0, regular and normal without murmurs, rubs or gallops noted.   Abdomen: Soft, non-tender and non-distended with normal bowel sounds appreciated on auscultation.  Extremities: There is no pitting edema in the distal lower extremities bilaterally.   Skin: Warm and dry without trophic changes noted.   Musculoskeletal: exam reveals no obvious joint deformities, tenderness or joint swelling or erythema.   Neurologically:  Mental status: The patient is awake, alert and oriented in all 4 spheres. His immediate and remote memory, attention, language skills and fund of knowledge are appropriate. There is no evidence of aphasia, agnosia, apraxia or anomia. Speech is clear with normal prosody and enunciation. Thought process is linear. Mood is normal and affect is normal.  Cranial nerves II - XII are as described above under HEENT exam.  Motor exam: Normal bulk,  strength and tone is noted. There is no tremor, Romberg is negative. Fine motor skills and coordination: grossly intact.  Cerebellar testing: No dysmetria or intention tremor. There is no truncal or gait ataxia.  Sensory exam: intact to light touch in the upper and lower extremities.  Gait, station and balance: He stands easily. No veering to one side is noted. No leaning to one side is noted. Posture is age-appropriate and stance is narrow based. Gait shows normal stride length and normal pace. No problems turning are noted.Tandem walk is unremarkable.                Assessment and Plan:  In summary, JAHAZIAH CHEESEMAN is a very pleasant 70 y.o.-year old male with an underlying medical history of diabetes, prostate cancer, osteoarthritis, memory loss and obesity, who may be at risk for sleep disordered breathing, such as obstructive sleep apnea (OSA). I had a long chat with the patient about my findings and the diagnosis of OSA, its prognosis and treatment options. We talked about medical treatments, surgical interventions and non-pharmacological approaches. I explained in particular the risks and ramifications of untreated moderate to severe OSA, especially with respect to developing cardiovascular disease down the Road, including congestive heart failure, difficult to treat hypertension, cardiac arrhythmias, or stroke. Even type 2 diabetes has, in part, been linked to untreated OSA. Symptoms of untreated OSA include daytime sleepiness, memory problems, mood irritability and mood disorder such as depression and anxiety, lack of energy, as well as recurrent headaches, especially morning headaches. We talked about trying to maintain a healthy lifestyle in general, as well as the importance of weight control. We also talked about the importance of good sleep hygiene. I recommended the following at this time: sleep study.  I explained the sleep test procedure to the patient and also outlined possible surgical and  non-surgical treatment options of OSA, including the use of a custom-made dental device (which would require a referral to a specialist dentist or oral surgeon), upper airway surgical options. I also explained the CPAP treatment option to the patient, who indicated that he would be reluctant to try CPAP. He is willing  to proceed with testing for now. I answered all his questions today and the patient was in agreement. I plan to see him back after the sleep study is completed and encouraged him to call with any interim questions, concerns, problems or updates.   Thank you very much for allowing me to participate in the care of this nice patient. If I can be of any further assistance to you please do not hesitate to call me at [phone].  Sincerely,   Star Age, MD, PhD

## 2019-08-14 NOTE — Patient Instructions (Signed)

## 2019-08-27 ENCOUNTER — Ambulatory Visit (INDEPENDENT_AMBULATORY_CARE_PROVIDER_SITE_OTHER): Payer: Medicare Other | Admitting: Neurology

## 2019-08-27 DIAGNOSIS — G4733 Obstructive sleep apnea (adult) (pediatric): Secondary | ICD-10-CM

## 2019-08-27 DIAGNOSIS — R413 Other amnesia: Secondary | ICD-10-CM

## 2019-08-27 DIAGNOSIS — E669 Obesity, unspecified: Secondary | ICD-10-CM

## 2019-08-27 DIAGNOSIS — G4731 Primary central sleep apnea: Secondary | ICD-10-CM

## 2019-08-27 DIAGNOSIS — Z9189 Other specified personal risk factors, not elsewhere classified: Secondary | ICD-10-CM

## 2019-08-27 DIAGNOSIS — G4720 Circadian rhythm sleep disorder, unspecified type: Secondary | ICD-10-CM

## 2019-08-28 ENCOUNTER — Other Ambulatory Visit: Payer: Self-pay

## 2019-09-11 ENCOUNTER — Telehealth: Payer: Self-pay

## 2019-09-11 NOTE — Procedures (Signed)
PATIENT'S NAME:  Calvin Salazar, Calvin Salazar DOB:      01-Apr-1951      MR#:    [id number]     DATE OF RECORDING: 08/27/2019 REFERRING M.D.:  Assunta Curtis, MD Study Performed:   Baseline Polysomnogram HISTORY: 69 year old man with a history of diabetes, prostate cancer, osteoarthritis, memory loss and obesity, who presents for sleep evaluation. The patient endorsed the Epworth Sleepiness Scale at 6 points. The patient's weight 258 pounds with a height of 69 (inches), resulting in a BMI of 38.2 kg/m2. The patient's neck circumference measured 19 inches.  CURRENT MEDICATIONS: Aspirin, Metformin, Crestor   PROCEDURE:  This is a multichannel digital polysomnogram utilizing the Somnostar 11.2 system.  Electrodes and sensors were applied and monitored per AASM Specifications.   EEG, EOG, Chin and Limb EMG, were sampled at 200 Hz.  ECG, Snore and Nasal Pressure, Thermal Airflow, Respiratory Effort, CPAP Flow and Pressure, Oximetry was sampled at 50 Hz. Digital video and audio were recorded.      BASELINE STUDY  Lights Out was at 20:59 and Lights On at 04:46.  Total recording time (TRT) was 467.5 minutes, with a total sleep time (TST) of 216 minutes.   The patient's sleep latency to persistent sleep was markedly delayed at 152.5 minutes.  REM latency was 310.5 minutes, which is markedly delayed. The sleep efficiency was 46.2%, which is significantly reduced.     SLEEP ARCHITECTURE: WASO (Wake after sleep onset) was 237.5 minutes with moderate to severe sleep fragmentation noted. There were 76 minutes in Stage N1, 63 minutes Stage N2, 43 minutes Stage N3 and 34 minutes in Stage REM.  The percentage of Stage N1 was 35.2%, which is markedly increased, Stage N2 was 29.2%, Stage N3 was 19.9% and Stage R (REM sleep) was 15.7%, which is mildly reduced. The arousals were noted as: 36 were spontaneous, 4 were associated with PLMs, 124 were associated with respiratory events.  RESPIRATORY ANALYSIS:  There were a total of 218  respiratory events:  21 obstructive apneas, 142 central apneas and 50 mixed apneas with a total of 213 apneas and an apnea index (AI) of 59.2 /hour. There were 5 hypopneas with a hypopnea index of 1.4 /hour. The patient also had 0 respiratory event related arousals (RERAs).      The total APNEA/HYPOPNEA INDEX (AHI) was 60.6/hour and the total RESPIRATORY DISTURBANCE INDEX was  60.6 /hour.  0 events occurred in REM sleep and 202 events in NREM. The REM AHI was  0 /hour, versus a non-REM AHI of 71.9. The patient spent 43 minutes of total sleep time in the supine position and 173 minutes in non-supine.. The supine AHI was 103.3 versus a non-supine AHI of 50.0.  OXYGEN SATURATION & C02:  The Wake baseline 02 saturation was 94%, with the lowest being 82%. Time spent below 89% saturation equaled 7 minutes.  PERIODIC LIMB MOVEMENTS: The patient had a total of 13 Periodic Limb Movements.  The Periodic Limb Movement (PLM) index was 3.6 and the PLM Arousal index was 1.1/hour.  Audio and video analysis did not show any abnormal or unusual movements, behaviors, phonations or vocalizations. He was notably restless. The patient took no bathroom breaks. Mild snoring was noted. The EKG was in keeping with normal sinus rhythm (NSR).  Post-study, the patient indicated that sleep was the same as usual.   IMPRESSION:  1. Primary Central Sleep Apnea  2. Obstructive Sleep Apnea (OSA) 3. Dysfunctions associated with sleep stages or arousal from sleep  RECOMMENDATIONS:  1. This study demonstrates severe complex sleep disordered breathing with a primary central sleep apnea component, mixed sleep apnea and obstructive events. Treatment with positive airway pressure is recommended and may require BiPAP or BiPAP ST or ASV. A full night titration study is recommended to determine treatment modality and to optimize therapy. Other treatment options may be limited, due to central apnea events. OSA may improve with weight loss,  upper airway or jaw surgery in selected patients or the use of an oral appliance in certain patients. ENT evaluation and/or consultation with a maxillofacial surgeon or dentist may be feasible in some instances. Please note that untreated obstructive sleep apnea may carry additional perioperative morbidity. Patients with significant obstructive sleep apnea should receive perioperative PAP therapy and the surgeons and particularly the anesthesiologist should be informed of the diagnosis and the severity of the sleep disordered breathing. 2. This study shows significant sleep fragmentation, low sleep efficiency, and abnormal sleep stage percentages; these are nonspecific findings and per se do not signify an intrinsic sleep disorder or a cause for the patient's sleep-related symptoms. Causes include (but are not limited to) the first night effect of the sleep study, circadian rhythm disturbances, medication effect or an underlying mood disorder or medical problem.  3. The patient should be cautioned not to drive, work at heights, or operate dangerous or heavy equipment when tired or sleepy. Review and reiteration of good sleep hygiene measures should be pursued with any patient. 4. The patient will be seen in follow-up in the sleep clinic at [facility] for discussion of the test results, symptom and treatment compliance review, further management strategies, etc. The referring provider will be notified of the test results.  I certify that I have reviewed the entire raw data recording prior to the issuance of this report in accordance with the Standards of Accreditation of the American Academy of Sleep Medicine (AASM)  Star Age, MD, PhD Diplomat, American Board of Neurology and Sleep Medicine (Neurology and Sleep Medicine)

## 2019-09-11 NOTE — Telephone Encounter (Signed)
I called pt to discuss. No answer, left a message asking him to call me back. 

## 2019-09-11 NOTE — Progress Notes (Signed)
Patient referred by Dr. Brigitte Pulse, seen by me on 08/14/19, diagnostic PSG on 08/27/19.   Please call and notify the patient that the recent sleep study showed severe sleep apnea. He did not sleep very well and had mixed events, including primarily central sleep apnea (ie brain "telling" him to pause breathing) and obstructive sleep apnea. I recommend treatment for this in the form of a machine, such as BiPAP or CPAP or another machine if needed. In order to determine his best treatment option, I recommend he return for a repeat sleep study for proper titration and mask fitting and correct monitoring of the oxygen saturations and his apnea events. Please explain to patient. I have placed an order in the chart. Thanks.  Star Age, MD, PhD [facility])

## 2019-09-11 NOTE — Addendum Note (Signed)
Addended by: Star Age on: 09/11/2019 12:44 PM   Modules accepted: Orders

## 2019-09-11 NOTE — Telephone Encounter (Signed)
-----   Message from Star Age, MD sent at 09/11/2019 12:44 PM EDT ----- Patient referred by Dr. Brigitte Pulse, seen by me on 08/14/19, diagnostic PSG on 08/27/19.   Please call and notify the patient that the recent sleep study showed severe sleep apnea. He did not sleep very well and had mixed events, including primarily central sleep apnea (ie brain "telling" him to pause breathing) and obstructive sleep apnea. I recommend treatment for this in the form of a machine, such as BiPAP or CPAP or another machine if needed. In order to determine his best treatment option, I recommend he return for a repeat sleep study for proper titration and mask fitting and correct monitoring of the oxygen saturations and his apnea events. Please explain to patient. I have placed an order in the chart. Thanks.  Star Age, MD, PhD [facility])

## 2019-09-13 NOTE — Telephone Encounter (Signed)
Pt wife called back in regards to missed call  

## 2019-09-14 NOTE — Telephone Encounter (Signed)
Pt's significant other Acquanetta Chain called regarding the pt's sleep study results. Please call back when available.

## 2019-09-14 NOTE — Telephone Encounter (Signed)
I called Calvin Salazar back she had the pt on the line to help him understand the results of the most recent sleep study. She and the pt verbalized understanding and he is agreeable to the titration study. Pt advised he would be receiving a call from our sleep lab once the insurance Calvin Salazar has been processed.

## 2019-10-02 ENCOUNTER — Ambulatory Visit (INDEPENDENT_AMBULATORY_CARE_PROVIDER_SITE_OTHER): Payer: Medicare Other | Admitting: Neurology

## 2019-10-02 DIAGNOSIS — E669 Obesity, unspecified: Secondary | ICD-10-CM

## 2019-10-02 DIAGNOSIS — G4733 Obstructive sleep apnea (adult) (pediatric): Secondary | ICD-10-CM | POA: Diagnosis not present

## 2019-10-02 DIAGNOSIS — G4720 Circadian rhythm sleep disorder, unspecified type: Secondary | ICD-10-CM

## 2019-10-02 DIAGNOSIS — G4731 Primary central sleep apnea: Secondary | ICD-10-CM | POA: Diagnosis not present

## 2019-10-02 DIAGNOSIS — R413 Other amnesia: Secondary | ICD-10-CM

## 2019-10-03 ENCOUNTER — Other Ambulatory Visit: Payer: Self-pay

## 2019-10-12 NOTE — Progress Notes (Signed)
Patient referred by Dr. Brigitte Pulse, seen by me on 08/14/19, diagnostic PSG on 08/27/19. Patient had a CPAP titration study on 10/02/19.  Please call and inform patient that I would like to suggest we start him on auto BiPAP at home.  Unfortunately, he did not do well during his titration study and achieved very little sleep.  He was tried on different treatment modalities including CPAP, BiPAP, BiPAP ST and even ASV.  If he is agreeable, I would like to see if we can get him approved for a machine called auto BiPAP.  We will have to see him within 60 to 90 days after starting treatment in clinic in follow-up to see how is doing and if he is able to tolerate the treatment and doing better.  Please arrange for follow-up with one of our nurse practitioners or with me.  I put an order in and we will arrange for a machine for home use through a DME (durable medical equipment) company of His choice; and I will see the patient back in follow-up in about 10 weeks. Please also explain to the patient that I will be looking out for compliance data, which can be downloaded from the machine (stored on an SD card, that is inserted in the machine) or via remote access through a modem, that is built into the machine. Please advise patient to bring His machine at the time of the first FU visit, even though this is cumbersome. Bringing the machine for every visit after that will likely not be needed, but often helps for the first visit to troubleshoot if needed. Please re-enforce the importance of compliance with treatment and the need for [place] to monitor compliance data - often an insurance requirement and actually good feedback for the patient as far as how they are doing.  Also remind patient, that any interim PAP machine or mask issues should be first addressed with the DME company, as they can often help better with technical and mask fit issues. Please ask if patient has a preference regarding DME company.  Star Age, MD,  PhD [facility])

## 2019-10-12 NOTE — Addendum Note (Signed)
Addended by: Star Age on: 10/12/2019 05:19 PM   Modules accepted: Orders

## 2019-10-12 NOTE — Procedures (Signed)
PATIENT'S NAME:  Calvin, Salazar DOB:      October 25, 1950      MR#:    [id number]     DATE OF RECORDING: 10/02/2019 REFERRING M.D.:  Assunta Curtis, MD Study Performed:   CPAP  Titration HISTORY: 69 year old man with a history of diabetes, prostate cancer, osteoarthritis, memory loss and obesity, who returns for a full night titration study. His baseline sleep study from 08/27/19 showed primarily central respiratory events and a low spo2 of 82%. The patient endorsed the Epworth Sleepiness Scale at 6 points. The patient's weight 258 pounds with a height of 69 (inches), resulting in a BMI of 38.2 kg/m2. The patient's neck circumference measured 19 inches.  CURRENT MEDICATIONS: Aspirin, Metformin, Crestor   PROCEDURE:  This is a multichannel digital polysomnogram utilizing the SomnoStar 11.2 system.  Electrodes and sensors were applied and monitored per AASM Specifications.   EEG, EOG, Chin and Limb EMG, were sampled at 200 Hz.  ECG, Snore and Nasal Pressure, Thermal Airflow, Respiratory Effort, CPAP Flow and Pressure, Oximetry was sampled at 50 Hz. Digital video and audio were recorded.      The patient was fitted with a cradle styly FFM. CPAP was initiated at 5 cmH20 with heated humidity per AASM standards but he did not tolerate CPAP and was switched to BiPAP of 8/4 cm and had persistent central apneas. He was tried on BiPAP ST with a back up rate of 12 but did not achieve achieve sleep for more than 3 or 4 minutes at a time. He was tried on ASV, but sleep at the most 3 minutes on one setting.   Lights Out was at 20:51 and Lights On at 02:58. The patient requested to end the study early and left early. Total recording time (TRT) was 368 minutes, with a total sleep time (TST) of 33 minutes. The sleep efficiency was 9. %.    SLEEP ARCHITECTURE: WASO (Wake after sleep onset)  was 325.5 minutes with minimal sleep achieved.  There were 29 minutes in Stage N1, 4 minutes Stage N2, 0 minutes Stage N3 and 0 minutes in  Stage REM.  The percentage of Stage N1 was 87.9%, Stage N2 was 12.1%, Stage N3 and Stage R (REM sleep) were absent. The arousals were noted as: 18 were spontaneous, 0 were associated with PLMs, 32 were associated with respiratory events.  RESPIRATORY ANALYSIS:  There was a total of 35 respiratory events: 0 obstructive apneas, 33 central apneas and 0 mixed apneas with a total of 33 apneas and an apnea index (AI) of 60 /hour. There were 2 hypopneas with a hypopnea index of 3.6/hour. The patient also had 0 respiratory event related arousals (RERAs).      The total APNEA/HYPOPNEA INDEX  (AHI) was 63.6 /hour and the total RESPIRATORY DISTURBANCE INDEX was 63.6 /hour  0 events occurred in REM sleep and 35 events in NREM. The REM AHI was 0 /hour versus a non-REM AHI of 63.6 /hour.  The patient spent 33 minutes of total sleep time in the supine position and 0 minutes in non-supine. The supine AHI was 63.6, versus a non-supine AHI of 0.0.  OXYGEN SATURATION & C02:  The baseline 02 saturation was 94%, with the lowest being 88%. Time spent below 89% saturation equaled 0 minutes.  PERIODIC LIMB MOVEMENTS:  The patient had a total of 0 Periodic Limb Movements. The Periodic Limb Movement (PLM) index was 0 and the PLM Arousal index was 0 /hour.  Audio and video analysis did  not show any abnormal or unusual movements, behaviors, phonations or vocalizations. The patient took 3 bathroom breaks. The EKG was in keeping with normal sinus rhythm (NSR).  Post-study, the patient indicated that sleep was worse than usual.   The patient was fitted with a Respironics Dreamwear medium FFM mask.  IMPRESSION:   1. Primary Central Sleep Apnea  2. Obstructive Sleep Apnea (OSA) 3. Dysfunctions associated with sleep stages or arousal from sleep   RECOMMENDATIONS:   1. This study was not successful in treating the patient's severe primary central sleep apnea. A recommendation for a treatment modality and pressure setting  cannot be made based on this study. I will recommend treatment with autoBiPAP for now. Other treatment options may be limited. Please note that untreated sleep apnea may carry additional perioperative morbidity. Patients with significant obstructive sleep apnea should receive perioperative PAP therapy and the surgeons and particularly the anesthesiologist should be informed of the diagnosis and the severity of the sleep disordered breathing. 2. This study shows significant sleep fragmentation, very low sleep efficiency, and abnormal sleep stage percentages; these are nonspecific findings and per se do not signify an intrinsic sleep disorder or a cause for the patient's sleep-related symptoms. Causes include (but are not limited to) the first night effect of the sleep study, circadian rhythm disturbances, medication effect or an underlying mood disorder or medical problem.  3. The patient should be cautioned not to drive, work at heights, or operate dangerous or heavy equipment when tired or sleepy. Review and reiteration of good sleep hygiene measures should be pursued with any patient. 4. The patient will be seen in follow-up in the sleep clinic at [facility] for discussion of the test results, symptom and treatment compliance review, further management strategies, etc. The referring provider will be notified of the test results.   I certify that I have reviewed the entire raw data recording prior to the issuance of this report in accordance with the Standards of Accreditation of the American Academy of Sleep Medicine (AASM)   Star Age, MD, PhD Diplomat, American Board of Neurology and Sleep Medicine (Neurology and Sleep Medicine)

## 2019-10-17 ENCOUNTER — Telehealth: Payer: Self-pay | Admitting: Neurology

## 2019-10-17 NOTE — Telephone Encounter (Signed)
Calvin Salazar (patient EC) called requesting that the sleep lab or RN call her to discuss patient's sleep study.

## 2019-10-17 NOTE — Telephone Encounter (Signed)
I called pt. No answer, left a message asking pt to call me back.    Star Age, MD  Anda Latina, RN Patient referred by Dr. Brigitte Pulse, seen by me on 08/14/19, diagnostic PSG on 08/27/19. Patient had a CPAP titration study on 10/02/19.  Please call and inform patient that I would like to suggest we start him on auto BiPAP at home. Unfortunately, he did not do well during his titration study and achieved very little sleep. He was tried on different treatment modalities including CPAP, BiPAP, BiPAP ST and even ASV.  If he is agreeable, I would like to see if we can get him approved for a machine called auto BiPAP. We will have to see him within 60 to 90 days after starting treatment in clinic in follow-up to see how is doing and if he is able to tolerate the treatment and doing better. Please arrange for follow-up with one of our nurse practitioners or with me. I put an order in and we will arrange for a machine for home use through a DME (durable medical equipment) company of His choice; and I will see the patient back in follow-up in about 10 weeks. Please also explain to the patient that I will be looking out for compliance data, which can be downloaded from the machine (stored on an SD card, that is inserted in the machine) or via remote access through a modem, that is built into the machine. Please advise patient to bring His machine at the time of the first FU visit, even though this is cumbersome. Bringing the machine for every visit after that will likely not be needed, but often helps for the first visit to troubleshoot if needed. Please re-enforce the importance of compliance with treatment and the need for [place] to monitor compliance data - often an insurance requirement and actually good feedback for the patient as far as how they are doing.  Also remind patient, that any interim PAP machine or mask issues should be first addressed with the DME company, as they can often help better with technical  and mask fit issues. Please ask if patient has a preference regarding DME company.   Star Age, MD, PhD  [facility])

## 2019-10-18 NOTE — Telephone Encounter (Signed)
I called Calvin Salazar and spoke with his wife ( ok per dpr). I advised Calvin Salazar that Dr. Rexene Alberts reviewed their sleep study results and found that Calvin Salazar should be treated with bipap for osa. Dr. Rexene Alberts recommends that Calvin Salazar start bipap treatment at home. I reviewed PAP compliance expectations with the Calvin Salazar. Calvin Salazar is agreeable to starting a BiPAP. I advised Calvin Salazar that an order will be sent to a DME, [employer], and [employer] will call the Calvin Salazar within about one week after they file with the Calvin Salazar's insurance. [employer] will show the Calvin Salazar how to use the machine, fit for masks, and troubleshoot the BiPAP if needed. A follow up appt was made for insurance purposes with Dr. Rexene Alberts on 01/16/2020 at 1pm. Calvin Salazar verbalized understanding to arrive 15 minutes early and bring their BiPAP. A letter with all of this information in it will be mailed to the Calvin Salazar as a reminder. I verified with the Calvin Salazar that the address we have on file is correct. Calvin Salazar verbalized understanding of results. Calvin Salazar had no questions at this time but was encouraged to call back if questions arise. I have sent the order to [employer] and have received confirmation that they have received the order.

## 2019-10-24 ENCOUNTER — Other Ambulatory Visit: Payer: Self-pay

## 2019-10-24 DIAGNOSIS — C61 Malignant neoplasm of prostate: Secondary | ICD-10-CM

## 2019-10-31 ENCOUNTER — Telehealth: Payer: Self-pay

## 2019-10-31 NOTE — Telephone Encounter (Signed)
Voicemail message, 11:03am:  Pt's wife called to let you know that he will not have his bi-pap machine before his next appt with Dr. Rexene Alberts and wondered if he needed to be rescheduled. She said his appt for the bi-pap is 11/21/19.   I checked his appts and his next OV with Dr. Rexene Alberts is in September so I'm not sure if she misspoke or if she has the dates wrong. She can be reached at [phone].

## 2019-10-31 NOTE — Telephone Encounter (Signed)
I called the pt's wife back and she sts the pt is scheduled to start his bipap on 11/15/2019 and f/u needs to pushed out due the 61-90 days of data needed.  Rescheduled for 01/24/2020 at 1 pm.

## 2019-12-14 ENCOUNTER — Ambulatory Visit: Payer: Medicare Other | Admitting: Urology

## 2020-01-08 ENCOUNTER — Ambulatory Visit: Payer: Medicare Other | Admitting: Urology

## 2020-01-16 ENCOUNTER — Ambulatory Visit: Payer: Self-pay | Admitting: Neurology

## 2020-01-18 ENCOUNTER — Ambulatory Visit: Payer: Medicare Other | Admitting: Urology

## 2020-01-24 ENCOUNTER — Other Ambulatory Visit: Payer: Self-pay

## 2020-01-24 ENCOUNTER — Encounter: Payer: Self-pay | Admitting: Neurology

## 2020-01-24 ENCOUNTER — Ambulatory Visit (INDEPENDENT_AMBULATORY_CARE_PROVIDER_SITE_OTHER): Payer: Medicare Other | Admitting: Neurology

## 2020-01-24 VITALS — BP 128/78 | HR 86 | Ht 68.0 in | Wt 235.0 lb

## 2020-01-24 DIAGNOSIS — Z789 Other specified health status: Secondary | ICD-10-CM

## 2020-01-24 DIAGNOSIS — G4733 Obstructive sleep apnea (adult) (pediatric): Secondary | ICD-10-CM

## 2020-01-24 DIAGNOSIS — G4731 Primary central sleep apnea: Secondary | ICD-10-CM | POA: Diagnosis not present

## 2020-01-24 NOTE — Progress Notes (Signed)
Subjective:    Patient ID: Calvin Salazar is a 69 y.o. male.  HPI     Interim history:   Mr. Calvin Salazar is a 69 year old right-handed gentleman with an underlying medical history of diabetes, prostate cancer, osteoarthritis, memory loss and obesity, who Presents for follow-up consultation of his obstructive sleep apnea after sleep testing.  Patient is unaccompanied today, his significant other, Stanton Kidney, had an appointment with another provider in our office.  I first met him at the request of his primary care physician on 08/14/2027.  He was advised to proceed with a sleep study.  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  He was advised to return for a full night titration study.  He had his titration study on 10/02/2019.  He was initiated on CPAP therapy but did not tolerate CPAP and was switched to BiPAP standard therapy at 8/4 centimeters but had persistent central apneas his sleep efficiency was low and he did not achieve sleep for more than a few minutes at a time, BiPAP ST was tried with a backup rate of 12/min and he was also briefly tried on ASV but did not sleep very long on it at all.  Overall, his titration study was not successful.  An optimal pressure setting and mortality was not determined.  He was advised to start auto BiPAP therapy at home.  Set up date was 11/21/2019.   Today, 01/24/2020: I reviewed his auto BiPAP compliance data from 12/24/2019 through 01/22/2020, which is a total of 30 days, during which time he used his machine 25 days with percent used days greater than 4 hours at 0 percent, indicating significantly suboptimal compliance with an average usage of 1 hour and 41 minutes for days on treatment, residual AHI high at 35.1/h, leak acceptable with a 95th percentile at 9.6 L/min on a maximum IPAP of 16, minimum EPAP of 4 and pressure support of 4.  Average pressure of approximately  13/9 centimeters, residual events appear to be primarily central in nature. He reports not sleeping well.  He is mostly awake when he uses his auto BiPAP.  He reports that this is not much different than before, he has not been a very good sleeper before.  He is willing to use his auto BiPAP longer.  He has not tried an over-the-counter sleep aid and is not keen on starting any new medications at this time.  He would be willing to come back in for another sleep study if need be.  He is not excited about this possibility but would be willing if it helps.  Ultimately, he would like to be off of any type of air blower machine.  He is   The patient's allergies, current medications, family history, past medical history, past social history, past surgical history and problem list were reviewed and updated as appropriate.   Previously:    08/14/19: (He) reports no significant sleep-related concerns.  He is not sure, if he snores. I reviewed your office note from 07/19/2019.  His MMSE was 28 out of 30 at the time.  His Epworth sleepiness score is 6 out of 24, fatigue severity score is 29 out of 63.  His A1c was above goal at 8.6 on 07/19/2019. He lives with his long-term girlfriend who has not complained about his snoring.  He is a retired Clinical biochemist.  He does not keep  a very set schedule for his sleep time and rise time, generally in bed probably between 8 and 9 PM.  He watches TV in the bedroom and turns the TV off when he is ready to fall asleep.  He typically gets out of bed between 8 and 9.  He does not have night to night nocturia and denies any morning headaches.  He is not aware of any family history of OSA.  His weight has fluctuated a little bit, he gained about 5 pounds in the past year.  He is a non-smoker, he does not drink caffeine daily, he drinks alcohol twice a week on average.  He does not take a nap every day, but occasionally.   His Past Medical History Is Significant For: Past Medical  History:  Diagnosis Date  . Arthritis   . Dyslipidemia   . Family history of colon cancer   . Frequency of urination   . History of kidney stones   . History of urinary retention    03/ 2017 due to kidney stone obstruction  . Hx of adenomatous colonic polyps 03/11/2015   tubular adenoma's  . Pre-diabetes   . Prostate cancer ([place]) UROLOGIST- DR MCKENZIE/  ONCOLOGIST-- DR Tammi Klippel   dx 01/ 2018,  Stage T1c,  Gleason 3+3, PSA 6.9, vol 31.7cc    His Past Surgical History Is Significant For: Past Surgical History:  Procedure Laterality Date  . COLONOSCOPY  last one 03-11-2015  . CYSTOSCOPY  08/17/2016   Procedure: CYSTOSCOPY FLEXIBLE;  Surgeon: Cleon Gustin, MD;  Location: [facility];  Service: Urology;;  no seeds found in bladder  . PROSTATE BIOPSY  05/05/2016  . RADIOACTIVE SEED IMPLANT N/A 08/17/2016   Procedure: RADIOACTIVE SEED IMPLANT/BRACHYTHERAPY IMPLANT;  Surgeon: Cleon Gustin, MD;  Location: [facility];  Service: Urology;  Laterality: N/A;   45 seeds implanted    His Family History Is Significant For: Family History  Problem Relation Age of Onset  . Colon cancer Mother 29       intestines  . Colon cancer Father 57  . Diabetes Maternal Grandmother     His Social History Is Significant For: Social History   Socioeconomic History  . Marital status: Unknown    Spouse name: Not on file  . Number of children: Not on file  . Years of education: Not on file  . Highest education level: Not on file  Occupational History  . Not on file  Tobacco Use  . Smoking status: Never Smoker  . Smokeless tobacco: Never Used  Substance and Sexual Activity  . Alcohol use: Yes    Alcohol/week: 0.0 standard drinks    Comment: occasionally  . Drug use: No  . Sexual activity: Not on file  Other Topics Concern  . Not on file  Social History Narrative  . Not on file   Social Determinants of Health   Financial Resource Strain:   .  Difficulty of Paying Living Expenses: Not on file  Food Insecurity:   . Worried About Charity fundraiser in the Last Year: Not on file  . Ran Out of Food in the Last Year: Not on file  Transportation Needs:   . Lack of Transportation (Medical): Not on file  . Lack of Transportation (Non-Medical): Not on file  Physical Activity:   . Days of Exercise per Week: Not on file  . Minutes of Exercise per Session: Not on file  Stress:   . Feeling  of Stress : Not on file  Social Connections:   . Frequency of Communication with Friends and Family: Not on file  . Frequency of Social Gatherings with Friends and Family: Not on file  . Attends Religious Services: Not on file  . Active Member of Clubs or Organizations: Not on file  . Attends Archivist Meetings: Not on file  . Marital Status: Not on file    His Allergies Are:  No Known Allergies:   His Current Medications Are:  Outpatient Encounter Medications as of 01/24/2020  Medication Sig  . aspirin EC 81 MG tablet Take 81 mg by mouth daily.  Marland Kitchen CINNAMON PO Take 1 capsule by mouth daily.   Marland Kitchen donepezil (ARICEPT) 10 MG tablet Take 10 mg by mouth at bedtime.  . metFORMIN (GLUCOPHAGE) 1000 MG tablet Take 1,000 mg by mouth 2 (two) times daily with a meal.  . rosuvastatin (CRESTOR) 10 MG tablet Take 10 mg by mouth daily.  . Semaglutide (RYBELSUS) 7 MG TABS Take by mouth.   Facility-Administered Encounter Medications as of 01/24/2020  Medication  . ceFAZolin (ANCEF) IVPB 1 g/50 mL premix  :  Review of Systems:  Out of a complete 14 point review of systems, all are reviewed and negative with the exception of these symptoms as listed below: Review of Systems  Neurological:       Here for f/u on bipap. Pt reports he has not liked his bipap. Pt reports he can not tolerate the mask.     Objective:  Neurological Exam  Physical Exam Physical Examination:   Vitals:   01/24/20 1359  BP: 128/78  Pulse: 86  SpO2: 96%    General  Examination: The patient is a very pleasant 69 y.o. male in no acute distress. He appears well-developed and well-nourished and well groomed.   HEENT: Normocephalic, atraumatic, pupils are equal, round and reactive to light, extraocular tracking is preserved, no obv. nystagmus noted. Hearing is grossly intact. Face is symmetric with normal facial animation. Speech is clear with no dysarthria noted. There is no hypophonia. There is no lip, neck/head, jaw or voice tremor. Neck is supple with full range of passive and active motion. There are no carotid bruits on auscultation. Oropharynx exam reveals: mild mouth dryness, adequate dental hygiene and moderate airway crowding. Tongue protrudes centrally and palate elevates symmetrically.  Chest: Clear to auscultation without wheezing, rhonchi or crackles noted.  Heart: S1+S2+0, regular and normal without murmurs, rubs or gallops noted.   Abdomen: Soft, non-tender and non-distended with normal bowel sounds appreciated on auscultation.  Extremities: There is no pitting edema in the distal lower extremities bilaterally.   Skin: Warm and dry without trophic changes noted.   Musculoskeletal: exam reveals no obvious joint deformities, tenderness or joint swelling or erythema.   Neurologically:  Mental status: The patient is awake, alert and oriented in all 4 spheres. His immediate and remote memory, attention, language skills and fund of knowledge are appropriate. There is no evidence of aphasia, agnosia, apraxia or anomia. Speech is clear with normal prosody and enunciation. Thought process is linear. Mood is normal and affect is normal.  Cranial nerves II - XII are as described above under HEENT exam.  Motor exam: Normal bulk, strength and tone is noted. There is no tremor. Fine motor skills and coordination: grossly intact.  Cerebellar testing: No dysmetria or intention tremor. There is no truncal or gait ataxia.  Sensory exam: intact to light  touch in the upper and  lower extremities.  Gait, station and balance: He stands easily. No veering to one side is noted. No leaning to one side is noted. Posture is age-appropriate and stance is narrow based. Gait shows normal stride length and normal pace. No problems turning are noted.   Assessment and Plan:  In summary, KHALIEL MOREY is a very pleasant 70 y.o.-year old male with an underlying medical history of diabetes, prostate cancer, osteoarthritis, memory loss and obesity, who presents for reevaluation of his sleep disorder.  He had a baseline sleep study and a titration study in the recent past.  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  He did not have a successful titration study when he returned in June.  He did not sleep very much at all.  He was tried on multiple different modalities including CPAP, BiPAP standard and with a backup rate and very briefly also on ASV.  He is currently on auto BiPAP.  He is not using the machine very much with an average usage of less than 2 hours.  His AHI is about half as severe as it was, again with a primary central component.  I would like to see about bringing him back in for a BiPAP titration study with possible transition to BiPAP ST or ASV.  In order to obtain more consolidated sleep or help with sleep onset I recommended over-the-counter melatonin.  The patient is not really keen on adding any new medications at this time.  Nevertheless, I encouraged him to try it as it is over-the-counter and a mild sleep enhancer, natural as opposed to a prescription sleep aid.  He is willing to return for a titration study.  We will get in touch with him after we have insurance authorization to pursue this.  In the meantime, he is encouraged to continue to use his auto BiPAP therapy. I answered all his questions today and he was in agreement.

## 2020-01-24 NOTE — Patient Instructions (Signed)
I appreciate, that you have tried to use your auto BiPAP.  I cannot tell that you are trying but you have not been able to keep it on for very long each night, and average of less than 2 hours.  I realize you have trouble going to sleep and staying asleep.  You can try Melatonin at night for sleep: take 1 mg to 3 mg, one to 2 hours before your bedtime. You can go up to 5 mg if needed. It is over the counter and comes in pill form, chewable form and spray, if you prefer.    Your auto BiPAP treatment has reduced your central sleep apneas approximately by half but we still have ways to go to improve your complex sleep apnea.  Therefore, as discussed, I would like to bring you back in for yet another sleep study to optimize your treatment with a BiPAP, or BiPAP ST machine or what we call ASV (adaptive servo ventilation), which is essentially a very sophisticated BiPAP machine.  We will check with your insurance for position of the test and call you to schedule your sleep study here.  In the meantime, you are encouraged to continue with your auto BiPAP machine, again, for better sleep consolidation you could try a little bit of melatonin which is a natural sleep enhancer rather than a sleeping pill.  I do realize that you do not wish to add any new medications to your regimen.

## 2020-02-07 ENCOUNTER — Ambulatory Visit (INDEPENDENT_AMBULATORY_CARE_PROVIDER_SITE_OTHER): Payer: Medicare Other | Admitting: Neurology

## 2020-02-07 ENCOUNTER — Other Ambulatory Visit: Payer: Self-pay

## 2020-02-07 DIAGNOSIS — G4720 Circadian rhythm sleep disorder, unspecified type: Secondary | ICD-10-CM

## 2020-02-07 DIAGNOSIS — G4733 Obstructive sleep apnea (adult) (pediatric): Secondary | ICD-10-CM

## 2020-02-07 DIAGNOSIS — Z789 Other specified health status: Secondary | ICD-10-CM

## 2020-02-07 DIAGNOSIS — G4731 Primary central sleep apnea: Secondary | ICD-10-CM

## 2020-02-07 DIAGNOSIS — G4761 Periodic limb movement disorder: Secondary | ICD-10-CM

## 2020-02-19 ENCOUNTER — Telehealth: Payer: Self-pay

## 2020-02-19 NOTE — Telephone Encounter (Signed)
I called pt. Pt's wife answered but pt does not have a DPR on file. She will ask pt to call me back.

## 2020-02-19 NOTE — Telephone Encounter (Signed)
-----   Message from Star Age, MD sent at 02/19/2020  3:17 PM EDT ----- Patient referred by Dr. Brigitte Pulse, seen by me on 08/14/19, diagnostic PSG on 08/27/19. Last seen 01/24/20. Patient had a CPAP titration study on 10/02/19.  He had another titration study on 02/07/2020 during which he was tried on BiPAP and ultimately ASV.  He did reasonably well on a machine called ASV -adaptive servo ventilation.  This is a more sophisticated machine and I would like to write for this type of machine so we can treat him better at home.  He will need a follow-up appointment between 60 and 90 days.

## 2020-02-19 NOTE — Progress Notes (Signed)
Patient referred by Dr. Brigitte Pulse, seen by me on 08/14/19, diagnostic PSG on 08/27/19. Last seen 01/24/20. Patient had a CPAP titration study on 10/02/19.  He had another titration study on 02/07/2020 during which he was tried on BiPAP and ultimately ASV.  He did reasonably well on a machine called ASV -adaptive servo ventilation.  This is a more sophisticated machine and I would like to write for this type of machine so we can treat him better at home.  He will need a follow-up appointment between 60 and 90 days.

## 2020-02-19 NOTE — Addendum Note (Signed)
Addended by: Star Age on: 02/19/2020 03:17 PM   Modules accepted: Orders

## 2020-02-19 NOTE — Procedures (Signed)
PATIENT'S NAME:  Calvin Salazar, Calvin Salazar DOB:      09/10/1950      MR#:    [id number]     DATE OF RECORDING: 02/07/2020 REFERRING M.D.:  Assunta Curtis, MD Study Performed:   CPAP  Titration HISTORY: 69 year old man with a history of diabetes, prostate cancer, osteoarthritis, memory loss and obesity, who presents for a titration study on ASV. 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 He had an unsuccessful titration study, during which he could not tolerate CPAP and was switched to BiPAP standard therapy at 8/4 centimeters but had persistent central apneas. BiPAP ST was tried with a backup rate of 12/min and he was also briefly tried on ASV but did not sleep very long on it at all. BMI 35.8 kg/m2. Neck circumference of 19 in.  CURRENT MEDICATIONS: Aspirin, Metformin, Crestor, Aricept, Rybelsus  PROCEDURE:  This is a multichannel digital polysomnogram utilizing the SomnoStar 11.2 system.  Electrodes and sensors were applied and monitored per AASM Specifications.   EEG, EOG, Chin and Limb EMG, were sampled at 200 Hz.  ECG, Snore and Nasal Pressure, Thermal Airflow, Respiratory Effort, CPAP Flow and Pressure, Oximetry was sampled at 50 Hz. Digital video and audio were recorded.      The patient was fitted with a large Dreamwear FFM. He was started on standard BiPAP therapy at 12/8 centimeters and titrated to a pressure of 15/10 centimeters.  He was switched to BiPAP ST with a backup rate of 10/min.  His overall AHI was between 24/h and 82/h.  He was then switched to ASV with an EPAP of 10 cm which was then increased to 12 cm.  On the EPAP of 10 cm with maximum pressure support of 10 cm, minimum pressure support of 3 cm his AHI was 0/h, O2 nadir 93%, with supine REM sleep achieved.  Lights Out was at 22:46 and Lights On at 02:53. Total recording time (TRT) was 247 minutes, with a total sleep time (TST) of 158.5 minutes.  The patient's sleep latency was 5 minutes. REM latency was 94.5 minutes.  The sleep efficiency was 64.2 %.    SLEEP ARCHITECTURE: WASO (Wake after sleep onset) was 57.5 minutes with mild to moderate sleep fragmentation noted. There were 36.5 minutes in Stage N1, 107 minutes Stage N2, 3 minutes Stage N3 and 12 minutes in Stage REM.  The percentage of Stage N1 was 23.%, which is markedly increased, Stage N2 was 67.5%, which is increased, Stage N3 was 1.9% and Stage R (REM sleep) was 7.6%, which is reduced. The arousals were noted as: 23 were spontaneous, 55 were associated with PLMs, 40 were associated with respiratory events.  RESPIRATORY ANALYSIS:  There was a total of 44 respiratory events: 0 obstructive apneas, 13 central apneas and 1 mixed apneas with a total of 14 apneas and an apnea index (AI) of 5.3 /hour. There were 30 hypopneas with a hypopnea index of 11.4/hour. The patient also had 6 respiratory event related arousals (RERAs).      The total APNEA/HYPOPNEA INDEX  (AHI) was 16.7 /hour and the total RESPIRATORY DISTURBANCE INDEX was 18.9 /hour  0 events occurred in REM sleep and 44 events in NREM. The REM AHI was 0 /hour versus a non-REM AHI of 18. /hour.  The patient spent 103 minutes of total sleep time in the supine position and 56 minutes in non-supine. The supine  AHI was 22.8, versus a non-supine AHI of 5.4.  OXYGEN SATURATION & C02:  The baseline 02 saturation was 94%, with the lowest being 91%. Time spent below 89% saturation equaled 0 minutes.  PERIODIC LIMB MOVEMENTS:  The patient had a total of 209 Periodic Limb Movements. The Periodic Limb Movement (PLM) index was 79.1 and the PLM Arousal index was 20.8 /hour.  Audio and video analysis did not show any abnormal or unusual movements, behaviors, phonations or vocalizations. The EKG was in keeping with normal sinus rhythm (NSR).  Post-study, the patient indicated that sleep was worse than usual.   IMPRESSION:   1. Primary Central  Sleep Apnea  2. Obstructive Sleep Apnea (OSA) 3. PLMD (periodic limb movement disorder) 4. Dysfunctions associated with sleep stages or arousal from sleep   RECOMMENDATIONS:   1. This study demonstrates resolution of the patient's primary central and obstructive sleep apnea with ASV (adaptive servo ventilation). His complex sleep apnea was not adequately treated with CPAP or standard BiPAP, nor BiPAP ST, as demonstrated during his prior titration study on 10/02/19. Ultimately, there were reasonably good results with ASV. I will recommend a home ASV setting of EPAP of 10 cm, max PS of 10 cm, min PS of 3 cm via large Dreamwear full face mask with (heated) humidity. The will be advised to be fully compliant with PAP therapy to improve sleep related symptoms and decrease long term cardiovascular risks. Please note that untreated obstructive sleep apnea may carry additional perioperative morbidity. Patients with significant obstructive sleep apnea should receive perioperative PAP therapy and the surgeons and particularly the anesthesiologist should be informed of the diagnosis and the severity of the sleep disordered breathing. 2. Severe PLMs (periodic limb movements of sleep) were noted during this study with moderate arousals; clinical correlation is recommended.  3. This study shows sleep fragmentation, low sleep efficiency, and abnormal sleep stage percentages; these are nonspecific findings and per se do not signify an intrinsic sleep disorder or a cause for the patient's sleep-related symptoms. Causes include (but are not limited to) the first night effect of the sleep study, circadian rhythm disturbances, medication effect or an underlying mood disorder or medical problem.  4. The patient should be cautioned not to drive, work at heights, or operate dangerous or heavy equipment when tired or sleepy. Review and reiteration of good sleep hygiene measures should be pursued with any patient. 5. The patient  will be seen in follow-up in the sleep clinic at [facility] for discussion of the test results, symptom and treatment compliance review, further management strategies, etc. The referring provider will be notified of the test results.   I certify that I have reviewed the entire raw data recording prior to the issuance of this report in accordance with the Standards of Accreditation of the American Academy of Sleep Medicine (AASM)   Star Age, MD, PhD Diplomat, American Board of Neurology and Sleep Medicine (Neurology and Sleep Medicine)

## 2020-02-20 NOTE — Telephone Encounter (Signed)
I called pt. I discussed his sleep study results and recommendations. Pt is agreeable to trying an ASV at home. A follow up was made for 05/22/20 at 10:30am. I advised pt that he should let us know if he does not hear from [place] regarding his ASV within the next 2 weeks. Pt verbalized understanding of results and of new appt date and time. Order for ASV sent to [facility] via community message. Confirmation received that the order transmitted was successful.

## 2020-04-02 ENCOUNTER — Ambulatory Visit: Payer: Medicare Other | Admitting: Neurology

## 2020-04-02 ENCOUNTER — Telehealth: Payer: Self-pay | Admitting: Neurology

## 2020-04-02 ENCOUNTER — Encounter: Payer: Self-pay | Admitting: Neurology

## 2020-04-02 VITALS — BP 126/82 | HR 73 | Ht 68.0 in | Wt 252.0 lb

## 2020-04-02 DIAGNOSIS — G4731 Primary central sleep apnea: Secondary | ICD-10-CM

## 2020-04-02 DIAGNOSIS — R413 Other amnesia: Secondary | ICD-10-CM

## 2020-04-02 DIAGNOSIS — G4733 Obstructive sleep apnea (adult) (pediatric): Secondary | ICD-10-CM | POA: Diagnosis not present

## 2020-04-02 NOTE — Patient Instructions (Addendum)
You have had some blood work through your primary care recently.    I would recommend checking blood work for vitamin D and vitamin B12 level and thyroid function.  You can get this done at your next blood work through your primary care physician.  We will continue to monitor your memory scores.  Continue with your current medications.  Please also continue with your ASV and we will follow up as scheduled for your sleep apnea recheck.  I would like to proceed with a brain MRI without contrast.  We will call you to schedule the scan at your convenience after we have obtained insurance authorization.  We will then call you with the results as well.   We can consider a formal neuropsychological test (aka cognitive testing) for your memory complaints. This requires a referral to a trained and licensed neuropsychologist and we can consider a referral in the near future.

## 2020-04-02 NOTE — Telephone Encounter (Signed)
[employer] medicare order sent to [facility]. No auth they will reach out to the patient to schedule.  

## 2020-04-02 NOTE — Progress Notes (Signed)
Subjective:    Patient ID: Calvin Salazar is a 69 y.o. male.  HPI     Interim history:   Calvin Salazar is a 69 year old right-handed gentleman with an underlying medical history of diabetes, prostate cancer, osteoarthritis, memory loss and obesity, who Presents for A new problem visit for memory loss at the request of his primary care physician.  The patient is accompanied by Calvin Salazar, his significant other, today.  I last saw him on 01/24/2020, at which time we talked about his sleep study results. He Was on auto BiPAP at the time and had difficulty with his sleep.  He had residual sleep disordered breathing.  He was advised to proceed with a sleep study for consideration of ASV treatment.  He has an interim BiPAP titration study on 02/07/2020.  He was started on BiPAP of 12/8 and titrated to 15/10 centimeters.  He was switched to BiPAP ST but had elevated AHI on BiPAP ST.  He was switched to ASV with an EPAP of 10 cm which was then increased to 12 cm.  His final settings were EPAP of 10 cm, maximum pressure support of 10 cm, minimum pressure support of 3 cm.  He was started on BiPAP treatment, set up date was 03/26/2020.  Today, 04/02/2020: He reports no specific memory related concerns.  Has no family history of dementia.  Calvin Salazar states that his kids are concerned, he is more forgetful and tends to repeat himself.  Issues have been ongoing for about a year.  He was started on Aricept generic about 6 months ago. He is on 10 mg strength at this time and seems to tolerate it well.  He had recent blood work through Calvin Salazar office and we will request test results.  He also had a memory test at the office today and his physical was today, we will request records. Per Calvin Salazar, he did well on the memory test at his primary care physician's office.  He has had some trouble adjusting to the ASV but slept really well last night, per Calvin Salazar.  He is a non-smoker and drinks alcohol about once a month, occasional caffeine.   He does not drink a whole lot of water by self-report.    The patient's allergies, current medications, family history, past medical history, past social history, past surgical history and problem list were reviewed and updated as appropriate.    Previously:      I first met him at the request of his primary care physician on 08/14/2027.  He was advised to proceed with a sleep study.  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  He was advised to return for a full night titration study.  He had his titration study on 10/02/2019.  He was initiated on CPAP therapy but did not tolerate CPAP and was switched to BiPAP standard therapy at 8/4 centimeters but had persistent central apneas his sleep efficiency was low and he did not achieve sleep for more than a few minutes at a time, BiPAP ST was tried with a backup rate of 12/min and he was also briefly tried on ASV but did not sleep very long on it at all.  Overall, his titration study was not successful.  An optimal pressure setting and modality was not determined.  He was advised to start auto BiPAP therapy at home.  Set up date was  11/21/2019.    I reviewed his auto BiPAP compliance data from 12/24/2019 through 01/22/2020, which is a total of 30 days, during which time he used his machine 25 days with percent used days greater than 4 hours at 0 percent, indicating significantly suboptimal compliance with an average usage of 1 hour and 41 minutes for days on treatment, residual AHI high at 35.1/h, leak acceptable with a 95th percentile at 9.6 L/min on a maximum IPAP of 16, minimum EPAP of 4 and pressure support of 4.  Average pressure of approximately 13/9 centimeters, residual events appear to be primarily central in nature.   08/14/19: (He) reports no significant sleep-related concerns.  He is not sure, if he snores. I reviewed your office note from  07/19/2019.  His MMSE was 28 out of 30 at the time.  His Epworth sleepiness score is 6 out of 24, fatigue severity score is 29 out of 63.  His A1c was above goal at 8.6 on 07/19/2019. He lives with his long-term girlfriend who has not complained about his snoring.  He is a retired Clinical biochemist.  He does not keep a very set schedule for his sleep time and rise time, generally in bed probably between 8 and 9 PM.  He watches TV in the bedroom and turns the TV off when he is ready to fall asleep.  He typically gets out of bed between 8 and 9.  He does not have night to night nocturia and denies any morning headaches.  He is not aware of any family history of OSA.  His weight has fluctuated a little bit, he gained about 5 pounds in the past year.  He is a non-smoker, he does not drink caffeine daily, he drinks alcohol twice a week on average.  He does not take a nap every day, but occasionally.  His Past Medical History Is Significant For: Past Medical History:  Diagnosis Date  . Arthritis   . Dyslipidemia   . Family history of colon cancer   . Frequency of urination   . History of Salazar stones   . History of urinary retention    03/ 2017 due to Salazar stone obstruction  . Hx of adenomatous colonic polyps 03/11/2015   tubular adenoma's  . Pre-diabetes   . Prostate cancer ([place]) UROLOGIST- DR Calvin [facility]azar/  ONCOLOGIST-- DR Calvin [facility]   dx 01/ 2018,  Stage T1c,  Gleason 3+3, PSA 6.9, vol 31.7cc    His Past Surgical History Is Significant For: Past Surgical History:  Procedure Laterality Date  . COLONOSCOPY  last one 03-11-2015  . CYSTOSCOPY  08/17/2016   Procedure: CYSTOSCOPY FLEXIBLE;  Surgeon: Calvin Gustin, MD;  Location: [facility];  Service: Urology;;  no seeds found in bladder  . PROSTATE BIOPSY  05/05/2016  . RADIOACTIVE SEED IMPLANT N/A 08/17/2016   Procedure: RADIOACTIVE SEED IMPLANT/BRACHYTHERAPY IMPLANT;  Surgeon: Calvin Gustin, MD;  Location: [facility];   Service: Urology;  Laterality: N/A;   62 seeds implanted    His Family History Is Significant For: Family History  Problem Relation Age of Onset  . Colon cancer Mother 76       intestines  . Colon cancer Father 2  . Diabetes Maternal Grandmother     His Social History Is Significant For: Social History   Socioeconomic History  . Marital status: Unknown    Spouse name: Not on file  . Number of children: Not on file  . Years of education: Not  on file  . Highest education level: Not on file  Occupational History  . Not on file  Tobacco Use  . Smoking status: Never Smoker  . Smokeless tobacco: Never Used  Substance and Sexual Activity  . Alcohol use: Yes    Alcohol/week: 0.0 standard drinks    Comment: occasionally  . Drug use: No  . Sexual activity: Not on file  Other Topics Concern  . Not on file  Social History Narrative  . Not on file   Social Determinants of Health   Financial Resource Strain:   . Difficulty of Paying Living Expenses: Not on file  Food Insecurity:   . Worried About Charity fundraiser in the Last Year: Not on file  . Ran Out of Food in the Last Year: Not on file  Transportation Needs:   . Lack of Transportation (Medical): Not on file  . Lack of Transportation (Non-Medical): Not on file  Physical Activity:   . Days of Exercise per Week: Not on file  . Minutes of Exercise per Session: Not on file  Stress:   . Feeling of Stress : Not on file  Social Connections:   . Frequency of Communication with Friends and Family: Not on file  . Frequency of Social Gatherings with Friends and Family: Not on file  . Attends Religious Services: Not on file  . Active Member of Clubs or Organizations: Not on file  . Attends Archivist Meetings: Not on file  . Marital Status: Not on file    His Allergies Are:  No Known Allergies:   His Current Medications Are:  Outpatient Encounter Medications as of 04/02/2020  Medication Sig  . aspirin EC 81  MG tablet Take 81 mg by mouth daily.  Marland Kitchen CINNAMON PO Take 1 capsule by mouth daily.   Marland Kitchen donepezil (ARICEPT) 10 MG tablet Take 10 mg by mouth at bedtime.  . metFORMIN (GLUCOPHAGE) 1000 MG tablet Take 1,000 mg by mouth 2 (two) times daily with a meal.  . rosuvastatin (CRESTOR) 10 MG tablet Take 10 mg by mouth daily.  . Semaglutide (RYBELSUS) 7 MG TABS Take by mouth.   Facility-Administered Encounter Medications as of 04/02/2020  Medication  . ceFAZolin (ANCEF) IVPB 1 g/50 mL premix  :  Review of Systems:  Out of a complete 14 point review of systems, all are reviewed and negative with the exception of these symptoms as listed below: Review of Systems  Neurological:       Pt presents today to discuss his memory. Pt reports that he started ASV last week and feels better with PAP therapy.    Objective:  Neurological Exam  Physical Exam Physical Examination:   Vitals:   04/02/20 1354  BP: 126/82  Pulse: 73   General Examination: The patient is a very pleasant 69 y.o. male in no acute distress. He appears well-developed and well-nourished and well groomed.   HEENT: Normocephalic, atraumatic, pupils are equal, round and reactive to light, extraocular tracking is preserved, no obv. nystagmus noted. Hearing is grossly intact. Face is symmetric with normal facial animation. Speech is clear with no dysarthria noted. There is no hypophonia. There is no lip, neck/head, jaw or voice tremor. Neck is supple with full range of passive and active motion. There are no carotid bruits on auscultation. Oropharynx exam reveals: mild mouth dryness, adequate dental hygiene and moderate airway crowding. Tongue protrudes centrally and palate elevates symmetrically.   Chest: Clear to auscultation without wheezing, rhonchi or  crackles noted.  Heart: S1+S2+0, regular and normal without murmurs, rubs or gallops noted.   Abdomen: Soft, non-tender and non-distended with normal bowel sounds appreciated on  auscultation.  Extremities: There is no pitting edema in the distal lower extremities bilaterally.   Skin: Warm and dry without trophic changes noted.   Musculoskeletal: exam reveals no obvious joint deformities, tenderness or joint swelling or erythema.   Neurologically:  Mental status: The patient is awake, alert and oriented in all 4 spheres. His immediate and remote memory, attention, language skills and fund of knowledge are fairly appropriate, but he is not very elaborate or forthcoming with information. Details of the history are provided by Calvin Salazar. Speech is clear with normal prosody and enunciation. Thought process is linear. Mood is normal and affect is normal.  On 04/02/2020: MMSE: 24/30 (he missed 1 point on orientation to place, 4 points on serial sevens, one-point on remote recall), CDT: 4/4, AFT: 16/min.   Cranial nerves II - XII are as described above under HEENT exam.  Motor exam: Normal bulk, strength and tone is noted. There is no tremor. Fine motor skills and coordination: grossly intact.  Cerebellar testing: No dysmetria or intention tremor. There is no truncal or gait ataxia.  Sensory exam: intact to light touch in the upper and lower extremities.  Gait, station and balance: He stands easily. No veering to one side is noted. No leaning to one side is noted. Posture is age-appropriate and stance is narrow based. Gait shows normal stride length and normal pace. No problems turning are noted.                Assessment and Plan:  In summary, NOVAK STGERMAINE is a very pleasant 69 year old male with an underlying medical history of diabetes, prostate cancer, osteoarthritis, memory loss and obesity, who  presents for evaluation of his memory loss of approximately 1 years duration.  He has had difficulty with short-term memory, tendency to repeat himself, forgetfulness, word finding difficulty at times.  His memory score is mildly abnormal, he lost 4 points on serial sevens today.   He had some blood work through his primary care physician, we will request test results and consult I do recommend that his vitamin B12, thyroid, and vitamin D level be checked.  This can be done at the next scheduled blood work through his primary care physician.  The patient is not keen on doing any blood work today.  He has been on Aricept 10 mg strength generic since June 2021 as I understand.  He can continue with this medication.  He has central sleep apnea and has recently started on ASV.  Is advised continue with full compliance and we will do a sleep apnea follow-up appointment as scheduled for compliance purposes.  He is advised to drink more water as he may not always hydrate well enough.  We talked about the importance of healthy lifestyle.  I would like to proceed also with a brain MRI without contrast.  He initially declined but is agreeable to pursue this at this time.  We will schedule at his convenience and call him with his results.  I answered all the questions today and the patient and his partner, Calvin Salazar, were in agreement with the plan.

## 2020-05-20 ENCOUNTER — Telehealth: Payer: Self-pay | Admitting: Neurology

## 2020-05-20 NOTE — Telephone Encounter (Signed)
Pt was on bump list, I called to r/s and his wife said that the appt was for his machine that he is not using. She asked that since he is not using the machine if they can just do a phone visit so I went ahead and rescheduled him and put it in as a virtual visit for Dr. Rexene Alberts to call them.

## 2020-05-20 NOTE — Telephone Encounter (Signed)
Noted, thank you

## 2020-05-22 ENCOUNTER — Ambulatory Visit: Payer: Self-pay | Admitting: Neurology

## 2020-06-11 ENCOUNTER — Ambulatory Visit: Payer: Self-pay | Admitting: Neurology

## 2020-06-19 ENCOUNTER — Encounter: Payer: Self-pay | Admitting: Internal Medicine

## 2020-07-02 ENCOUNTER — Other Ambulatory Visit: Payer: Self-pay

## 2020-07-02 ENCOUNTER — Encounter: Payer: Self-pay | Admitting: Neurology

## 2020-07-02 ENCOUNTER — Ambulatory Visit (INDEPENDENT_AMBULATORY_CARE_PROVIDER_SITE_OTHER): Payer: Medicare Other | Admitting: Neurology

## 2020-07-02 DIAGNOSIS — G4731 Primary central sleep apnea: Secondary | ICD-10-CM

## 2020-07-02 DIAGNOSIS — R413 Other amnesia: Secondary | ICD-10-CM

## 2020-07-02 NOTE — Progress Notes (Signed)
Interim history:   Mr. Cansler is a 70 year old right-handed gentleman with an underlying medical history of diabetes, prostate cancer, osteoarthritis, memory loss and obesity, who Presents for a virtual, phone based visit for FU consultation of his memory loss and sleep apnea. The patient is accompanied by Stanton Kidney, his significant other, today. He is located at home, I am in my office.  I had to call the mobile number as the home number listed had significant disturbance and I could not hear them well enough, I called [phone] and had a good connection.  I last saw him on 04/02/2020, at which time he Reported no significant memory related concerns.  Stanton Kidney reported that his kids were concerned that he was more repetitive and forgetful.  Memory loss was going on for about a year.  He had been started on Aricept about 6 months prior.  He was struggling with his ASV, but slept well the night prior to his visit.  Today, 07/02/2020: He reports that he is uncomfortable with the mask and he sleeps well without the machine.  Stanton Kidney reports that his memory seems to get worse.  He does not take his medicines regularly on his own so she has to give him his medicine.  They are in the process of moving, in fact, they are moving today to a different home, single level.  He has not taking his donepezil as far she estimates for the past at least a week.    The patient's allergies, current medications, family history, past medical history, past social history, past surgical history and problem list were reviewed and updated as appropriate.    Previously:      I saw him on 01/24/2020, at which time we talked about his sleep study results. He Was on auto BiPAP at the time and had difficulty with his sleep.  He had residual sleep disordered breathing.  He was advised to proceed with a sleep study for consideration of ASV treatment.  He has an interim BiPAP titration study on 02/07/2020.  He was started on BiPAP of 12/8 and  titrated to 15/10 centimeters.  He was switched to BiPAP ST but had elevated AHI on BiPAP ST.  He was switched to ASV with an EPAP of 10 cm which was then increased to 12 cm.  His final settings were EPAP of 10 cm, maximum pressure support of 10 cm, minimum pressure support of 3 cm.  He was started on BiPAP treatment, set up date was 03/26/2020.   I first met him at the request of his primary care physician on 08/14/2027.  He was advised to proceed with a sleep study.  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  He was advised to return for a full night titration study.  He had his titration study on 10/02/2019.  He was initiated on CPAP therapy but did not tolerate CPAP and was switched to BiPAP standard therapy at 8/4 centimeters but had persistent central apneas his sleep efficiency was low and he did not achieve sleep for more than a few minutes at a time, BiPAP ST was tried with a backup rate of 12/min and he was also briefly tried on ASV but did not sleep very long on it at all.  Overall, his titration study was not successful.  An optimal pressure setting and modality was not determined.  He was advised  to start auto BiPAP therapy at home.  Set up date was 11/21/2019.    I reviewed his auto BiPAP compliance data from 12/24/2019 through 01/22/2020, which is a total of 30 days, during which time he used his machine 25 days with percent used days greater than 4 hours at 0 percent, indicating significantly suboptimal compliance with an average usage of 1 hour and 41 minutes for days on treatment, residual AHI high at 35.1/h, leak acceptable with a 95th percentile at 9.6 L/min on a maximum IPAP of 16, minimum EPAP of 4 and pressure support of 4.  Average pressure of approximately 13/9 centimeters, residual events appear to be primarily central in nature.    08/14/19: (He) reports no significant sleep-related  concerns.  He is not sure, if he snores. I reviewed your office note from 07/19/2019.  His MMSE was 28 out of 30 at the time.  His Epworth sleepiness score is 6 out of 24, fatigue severity score is 29 out of 63.  His A1c was above goal at 8.6 on 07/19/2019. He lives with his long-term girlfriend who has not complained about his snoring.  He is a retired Clinical biochemist.  He does not keep a very set schedule for his sleep time and rise time, generally in bed probably between 8 and 9 PM.  He watches TV in the bedroom and turns the TV off when he is ready to fall asleep.  He typically gets out of bed between 8 and 9.  He does not have night to night nocturia and denies any morning headaches.  He is not aware of any family history of OSA.  His weight has fluctuated a little bit, he gained about 5 pounds in the past year.  He is a non-smoker, he does not drink caffeine daily, he drinks alcohol twice a week on average.  He does not take a nap every day, but occasionally.  His Past Medical History Is Significant For: Past Medical History:  Diagnosis Date  . Arthritis   . Dyslipidemia   . Family history of colon cancer   . Frequency of urination   . History of kidney stones   . History of urinary retention    03/ 2017 due to kidney stone obstruction  . Hx of adenomatous colonic polyps 03/11/2015   tubular adenoma's  . Pre-diabetes   . Prostate cancer ([place]) UROLOGIST- DR MCKENZIE/  ONCOLOGIST-- DR Tammi Klippel   dx 01/ 2018,  Stage T1c,  Gleason 3+3, PSA 6.9, vol 31.7cc    His Past Surgical History Is Significant For: Past Surgical History:  Procedure Laterality Date  . COLONOSCOPY  last one 03-11-2015  . CYSTOSCOPY  08/17/2016   Procedure: CYSTOSCOPY FLEXIBLE;  Surgeon: Cleon Gustin, MD;  Location: [facility];  Service: Urology;;  no seeds found in bladder  . PROSTATE BIOPSY  05/05/2016  . RADIOACTIVE SEED IMPLANT N/A 08/17/2016   Procedure: RADIOACTIVE SEED IMPLANT/BRACHYTHERAPY  IMPLANT;  Surgeon: Cleon Gustin, MD;  Location: [facility];  Service: Urology;  Laterality: N/A;   13 seeds implanted    His Family History Is Significant For: Family History  Problem Relation Age of Onset  . Colon cancer Mother 45       intestines  . Colon cancer Father 29  . Diabetes Maternal Grandmother     His Social History Is Significant For: Social History   Socioeconomic History  . Marital status: Unknown    Spouse name: Not on file  .  Number of children: Not on file  . Years of education: Not on file  . Highest education level: Not on file  Occupational History  . Not on file  Tobacco Use  . Smoking status: Never Smoker  . Smokeless tobacco: Never Used  Substance and Sexual Activity  . Alcohol use: Yes    Alcohol/week: 0.0 standard drinks    Comment: occasionally  . Drug use: No  . Sexual activity: Not on file  Other Topics Concern  . Not on file  Social History Narrative  . Not on file   Social Determinants of Health   Financial Resource Strain: Not on file  Food Insecurity: Not on file  Transportation Needs: Not on file  Physical Activity: Not on file  Stress: Not on file  Social Connections: Not on file    His Allergies Are:  No Known Allergies:   His Current Medications Are:  Outpatient Encounter Medications as of 07/02/2020  Medication Sig  . aspirin EC 81 MG tablet Take 81 mg by mouth daily.  Marland Kitchen CINNAMON PO Take 1 capsule by mouth daily.   Marland Kitchen donepezil (ARICEPT) 10 MG tablet Take 10 mg by mouth at bedtime.  . metFORMIN (GLUCOPHAGE) 1000 MG tablet Take 1,000 mg by mouth 2 (two) times daily with a meal.  . rosuvastatin (CRESTOR) 10 MG tablet Take 10 mg by mouth daily.  . Semaglutide (RYBELSUS) 7 MG TABS Take by mouth.   Facility-Administered Encounter Medications as of 07/02/2020  Medication  . ceFAZolin (ANCEF) IVPB 1 g/50 mL premix  :  Review of Systems:  Out of a complete 14 point review of systems, all are reviewed and  negative with the exception of these symptoms as listed below:  Virtual Visit via Telephone Note on 07/02/20:   I connected with Kasandra Knudsen and Mary on 07/02/20 at  9:30 AM EST by telephone and verified that I am speaking with the correct person using two identifiers.   I discussed the limitations, risks, security and privacy concerns of performing an evaluation and management service by telephone and the availability of in person appointments. I also discussed with the patient that there may be a patient responsible charge related to this service. The patient expressed understanding and agreed to proceed.   History of Present Illness: See above.   Observations/Objective: He is able to converse without difficulty, comprehension appears to be good, he talks in full sentences without any labored breathing.  He is able to provide answers to specific questions but is not very elaborate with his answers.  Assessment and Plan: In summary,Bryant R Barnesis a 62 year oldmalewith an underlying medical history of diabetes, prostate cancer, osteoarthritis, memory loss and obesity, whopresents for a virtual visit via phone call for follow-up consultation of his memory loss of approximately 15 months duration.  His significant other is worried about progression of his memory loss, he has had difficulty with short-term memory, repeats himself, is forgetful and has had word finding difficulty. His MMSE was 24 out of 30 on 04/02/2020.  He had recently had blood work through his primary care and I recommended additional blood work to check for thyroid dysfunction, B12 deficiency and vitamin D deficiency.  He was advised to proceed with a brain MRI.  He has not had it yet.  He is advised to go back on Aricept as he has stopped taking it about a week ago or longer ago.  We talked about the importance of treating severe sleep apnea as well.  He is currently no longer on ASV since late December 2021.  He is encouraged to  restart treatment with his medication as well as ASV once he is settled in his new home.  They are in the process of moving today.  He is advised to follow-up in the clinic to see one of our nurse practitioners or myself in about 3 months.    Follow Up Instructions:  He had agreed to pursue the MRI, we will inquire when we call him for the follow-up appointment as to the status of scheduling the brain MRI.  FU 3 mo in clinic  I discussed the assessment and treatment plan with the patient. The patient was provided an opportunity to ask questions and all were answered. The patient agreed with the plan and demonstrated an understanding of the instructions.   The patient was advised to call back or seek an in-person evaluation if the symptoms worsen or if the condition fails to improve as anticipated.  I provided 11 minutes of non-face-to-face time during this encounter.   Star Age, MD

## 2020-07-02 NOTE — Patient Instructions (Signed)
Verbal instructions given

## 2020-07-17 ENCOUNTER — Other Ambulatory Visit: Payer: Self-pay

## 2020-07-17 NOTE — Telephone Encounter (Signed)
I called the pt and left a vm asking for a call back. Need to discuss 3 month f/u scheduling as well as MRI status?

## 2020-08-05 NOTE — Telephone Encounter (Signed)
The patient wife called stating that they are going to get the MRI done and they were advised to called [facility] to schedule.

## 2020-08-08 ENCOUNTER — Encounter: Payer: Medicare Other | Admitting: Internal Medicine

## 2020-08-20 ENCOUNTER — Encounter: Payer: Self-pay | Admitting: Internal Medicine

## 2020-10-07 ENCOUNTER — Telehealth: Payer: Self-pay | Admitting: Neurology

## 2020-10-07 NOTE — Telephone Encounter (Signed)
Pt's Significant other, Acquanetta Chain (on [place]) called, about scheduling his CT Scan. Would like a call from the nurse.

## 2020-10-07 NOTE — Telephone Encounter (Signed)
April 02, 2020  Calvin Salazar   EE   3:12 PM Note [facility] medicare order sent to GI. No auth they will reach out to the patient to schedule.     Pt was ordered MRI scan back in Dec but was never completed.  I called pt's wife back and advised to contact [facility] to schedule MRI.  She verbalized understanding. Will update pt with results once received.

## 2020-10-08 ENCOUNTER — Ambulatory Visit
Admission: RE | Admit: 2020-10-08 | Discharge: 2020-10-08 | Disposition: A | Discharge status: Home / Self Care | Payer: Medicare Other | Source: Ambulatory Visit | Attending: Neurology | Admitting: Neurology

## 2020-10-08 ENCOUNTER — Other Ambulatory Visit: Payer: Self-pay

## 2020-10-08 DIAGNOSIS — G4731 Primary central sleep apnea: Secondary | ICD-10-CM

## 2020-10-08 DIAGNOSIS — R413 Other amnesia: Secondary | ICD-10-CM

## 2020-10-08 DIAGNOSIS — G4733 Obstructive sleep apnea (adult) (pediatric): Secondary | ICD-10-CM

## 2020-10-10 ENCOUNTER — Telehealth: Payer: Self-pay

## 2020-10-10 NOTE — Telephone Encounter (Signed)
-----   Message from Star Age, MD sent at 10/10/2020  9:32 AM EDT ----- Please call patient regarding his brain MRI: It did not show any acute findings and mostly age-appropriate chronic findings.  He did have evidence of chronic sinusitis.  Please inquire if he has chronic sinus issues including congestion, drainage, mucus, pressure sensation.  He may benefit from seeing an ENT specialist.  He can talk to his primary care physician if he has sinus symptoms and may need to see the PCP first.  From our end of things, he is advised to follow-up with the nurse practitioner for his memory loss.  He had a virtual visit some 3 months ago when he was supposed to restart his memory medication at the time and he was supposed to restart his ASV treatment for sleep apnea.  Please inquire if he has done so.  Please assist in scheduling follow-up appointment with the nurse practitioner.

## 2020-10-10 NOTE — Telephone Encounter (Addendum)
I called the pt and spoke with Calvin Salazar ( ok per dpr). I advised of results of MRI and she verbalized understanding.   She confirmed the pt is taking the donepezil 10 mg daily but has not restarted ASV. She sts the pt has trouble tolerating the machine and has mentioned returning the machine to [facility]. I advised Calvin Salazar of the the health risk leaving the degree of csa untreated and she sts she is going to try and encourage the pt to come today.  I tried to get the pt scheduled for his 3 month f/u appt but pt or pt's care taker had prior commitments on the several appts offered.. I advised I would monitor the schedule and call back. Pt prefers a Tues/ Thurs.

## 2020-10-10 NOTE — Telephone Encounter (Signed)
Noted, thank you

## 2021-06-17 ENCOUNTER — Encounter: Payer: Self-pay | Admitting: Internal Medicine

## 2021-08-14 ENCOUNTER — Ambulatory Visit: Payer: Medicare Other | Admitting: Neurology

## 2021-08-14 ENCOUNTER — Encounter: Payer: Self-pay | Admitting: Neurology

## 2021-08-14 VITALS — BP 122/83 | HR 78 | Ht 70.0 in | Wt 243.4 lb

## 2021-08-14 DIAGNOSIS — G4731 Primary central sleep apnea: Secondary | ICD-10-CM | POA: Diagnosis not present

## 2021-08-14 DIAGNOSIS — R413 Other amnesia: Secondary | ICD-10-CM

## 2021-08-14 MED ORDER — MEMANTINE HCL 10 MG PO TABS: 5.0000 mg | ORAL_TABLET | Freq: Two times a day (BID) | ORAL | 5 refills | Status: DC

## 2021-08-14 NOTE — Progress Notes (Signed)
Subjective:  ?  ?Patient ID: Calvin Salazar is a 71 y.o. male. ? ?HPI ? ? ? ?Interim history:  ? ?Calvin Salazar is a 71 year old right-handed gentleman with an underlying medical history of diabetes, prostate cancer, osteoarthritis, memory loss and obesity, who Presents for a virtual, phone based visit for FU consultation of his memory loss and sleep apnea. The patient is accompanied by Calvin Salazar, his significant other, today. We had a phone call appointment on 07/02/20, at which time he reported having discomfort with the mask.  He felt that he was sleeping well without the machine.  Mary felt that his memory was getting worse.  He had stopped taking his donepezil.  He was advised to stop his medication.  He was advised to restart using the ASV.  He was agreeable to pursuing a brain MRI. ? ?He had a brain without contrast on 10/08/2020 and I reviewed the results:   ?  ?IMPRESSION: Unremarkable MRI scan of the brain without contrast showing only mild age-appropriate changes of chronic small vessel disease.  Incidental changes of maxillary sinusitis small mucous retention cyst/polyp is noted in the right maxillary antrum. ?  ?They were contacted by phone regarding follow-up and regarding the brain MRI results.  They declined scheduling a follow-up appointment at the time. ? ?Today, 08/14/2021: he reports that his memory is no worse or better.  He provides very little information and answers in very short sentences.  He still drives, Calvin Salazar reports that his driving is impaired due to difficulty with direction, she uses the GPS when he drives even when they go to familiar places.  She feels that his memory has become worse.  She reports that he does not snore, they gave the ASV back last year, he has not used it, she reports that he will not use it and he reports that he would not want to go through another sleep study or use a CPAP like machine again.  We talked about his MRI results.  They saw his primary care recently and he  continues to take generic Aricept 10 mg daily per PCP.  They were reportedly encouraged to make a follow-up appointment with me.  He reports that he hydrates well with water, unclear how much water he drinks.  He drinks about a quart of milk per day.  He may drink 1 serving of soda per day, very occasional alcohol.  He does not smoke. ? ?The patient's allergies, current medications, family history, past medical history, past social history, past surgical history and problem list were reviewed and updated as appropriate.  ?  ?Previously:   ?  ?I saw him on 04/02/2020, at which time he reported no significant memory related concerns.  Calvin Salazar reported that his kids were concerned that he was more repetitive and forgetful.  Memory loss was going on for about a year.  He had been started on Aricept about 6 months prior. ?  ?He was struggling with his ASV, but slept well the night prior to his visit. ?  ?  ?  ?  ?I saw him on 01/24/2020, at which time we talked about his sleep study results. He Was on auto BiPAP at the time and had difficulty with his sleep.  He had residual sleep disordered breathing.  He was advised to proceed with a sleep study for consideration of ASV treatment.  He has an interim BiPAP titration study on 02/07/2020.  He was started on BiPAP of 12/8 and titrated to 15/10  centimeters.  He was switched to BiPAP ST but had elevated AHI on BiPAP ST.  He was switched to ASV with an EPAP of 10 cm which was then increased to 12 cm.  His final settings were EPAP of 10 cm, maximum pressure support of 10 cm, minimum pressure support of 3 cm.  He was started on BiPAP treatment, set up date was 03/26/2020. ?  ?I first met him at the request of his primary care physician on 08/14/2027.  He was advised to proceed with a sleep study.  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  He was advised to return for a full night  titration study.  He had his titration study on 10/02/2019.  He was initiated on CPAP therapy but did not tolerate CPAP and was switched to BiPAP standard therapy at 8/4 centimeters but had persistent central apneas his sleep efficiency was low and he did not achieve sleep for more than a few minutes at a time, BiPAP ST was tried with a backup rate of 12/min and he was also briefly tried on ASV but did not sleep very long on it at all.  Overall, his titration study was not successful.  An optimal pressure setting and modality was not determined.  He was advised to start auto BiPAP therapy at home.  Set up date was 11/21/2019.  ?  ?I reviewed his auto BiPAP compliance data from 12/24/2019 through 01/22/2020, which is a total of 30 days, during which time he used his machine 25 days with percent used days greater than 4 hours at 0 percent, indicating significantly suboptimal compliance with an average usage of 1 hour and 41 minutes for days on treatment, residual AHI high at 35.1/h, leak acceptable with a 95th percentile at 9.6 L/min on a maximum IPAP of 16, minimum EPAP of 4 and pressure support of 4.  Average pressure of approximately 13/9 centimeters, residual events appear to be primarily central in nature.  ?  ?08/14/19: (He) reports no significant sleep-related concerns.  He is not sure, if he snores. ?I reviewed your office note from 07/19/2019.  His MMSE was 28 out of 30 at the time.  His Epworth sleepiness score is 6 out of 24, fatigue severity score is 29 out of 63.  His A1c was above goal at 8.6 on 07/19/2019. ?He lives with his long-term girlfriend who has not complained about his snoring.  He is a retired Clinical biochemist.  He does not keep a very set schedule for his sleep time and rise time, generally in bed probably between 8 and 9 PM.  He watches TV in the bedroom and turns the TV off when he is ready to fall asleep.  He typically gets out of bed between 8 and 9.  He does not have night to night nocturia and  denies any morning headaches.  He is not aware of any family history of OSA.  His weight has fluctuated a little bit, he gained about 5 pounds in the past year.  He is a non-smoker, he does not drink caffeine daily, he drinks alcohol twice a week on average.  He does not take a nap every day, but occasionally. ? ?His Past Medical History Is Significant For: ?Past Medical History:  ?Diagnosis Date  ? Arthritis   ? Dyslipidemia   ? Family history of colon cancer   ? Frequency  of urination   ? History of Salazar stones   ? History of urinary retention   ? 03/ 2017 due to Salazar stone obstruction  ? Hx of adenomatous colonic polyps 03/11/2015  ? tubular adenoma's  ? Pre-diabetes   ? Prostate cancer ([place]) UROLOGIST- DR MCKENZIE/  ONCOLOGIST-- DR MANNING  ? dx 01/ 2018,  Stage T1c,  Gleason 3+3, PSA 6.9, vol 31.7cc  ? ? ?His Past Surgical History Is Significant For: ?Past Surgical History:  ?Procedure Laterality Date  ? COLONOSCOPY  last one 03-11-2015  ? CYSTOSCOPY  08/17/2016  ? Procedure: CYSTOSCOPY FLEXIBLE;  Surgeon: Cleon Gustin, MD;  Location: [facility];  Service: Urology;;  no seeds found in bladder  ? PROSTATE BIOPSY  05/05/2016  ? RADIOACTIVE SEED IMPLANT N/A 08/17/2016  ? Procedure: RADIOACTIVE SEED IMPLANT/BRACHYTHERAPY IMPLANT;  Surgeon: Cleon Gustin, MD;  Location: [facility];  Service: Urology;  Laterality: N/A;   76 seeds implanted  ? ? ?His Family History Is Significant For: ?Family History  ?Problem Relation Age of Onset  ? Colon cancer Mother 61  ?     intestines  ? Colon cancer Father 32  ? Diabetes Maternal Grandmother   ? Alzheimer's disease Neg Hx   ? Dementia Neg Hx   ? ? ?His Social History Is Significant For: ?Social History  ? ?Socioeconomic History  ? Marital status: Unknown  ?  Spouse name: Not on file  ? Number of children: Not on file  ? Years of education: Not on file  ? Highest education level: Not on file  ?Occupational History  ? Not on file   ?Tobacco Use  ? Smoking status: Never  ? Smokeless tobacco: Never  ?Substance and Sexual Activity  ? Alcohol use: Yes  ?  Alcohol/week: 0.0 standard drinks  ?  Comment: occasionally  ? Drug use: No  ? Sexual [place]

## 2021-08-14 NOTE — Patient Instructions (Signed)
We will start you for your memory on Namenda (generic name: Memantine), starting at 5 mg twice daily with gradual buildup to 10 mg twice daily. Please note that side effects may include, but are not limited to: nausea, confusion, hallucination, personality changes. If you are having mild side effects, try to stick with the treatment as these initial side effects may go away after the first 10-14 days.    ?Please follow-up in 3 months to see one of our nurse practitioners.  If you change your mind about reevaluation for sleep apnea, let us know and I will order another sleep study. ?

## 2021-11-26 NOTE — Progress Notes (Unsigned)
[facility] [address] [place] [address] [phone]       OFFICE FOLLOW UP NOTE  Calvin Salazar Date of Birth:  Jun 14, 1950 Medical Record Number:  [id number]   Reason for visit:     SUBJECTIVE:   CHIEF COMPLAINT:  No chief complaint on file.   HPI:   Update 11/26/2021 JM: Patient returns for follow-up visit after prior visit with Dr. Rexene Alberts approximately 3 months ago.  At that visit, he was started on Namenda 5 mg twice daily recommended continuation of Aricept.  Also discussed underlying complex sleep apnea previously treated with ASV but remains reluctant on apnea treatment.          History provided from Dr. Guadelupe Sabin prior [place] note for reference purposes only Calvin Salazar is a 71 year old right-handed gentleman with an underlying medical history of diabetes, prostate cancer, osteoarthritis, memory loss and obesity, who Presents for a virtual, phone based visit for FU consultation of his memory loss and sleep apnea. The patient is accompanied by Calvin Salazar, his significant other, today. We had a phone call appointment on 07/02/20, at which time he reported having discomfort with the mask.  He felt that he was sleeping well without the machine.  Mary felt that his memory was getting worse.  He had stopped taking his donepezil.  He was advised to stop his medication.  He was advised to restart using the ASV.  He was agreeable to pursuing a brain MRI.   He had a brain without contrast on 10/08/2020 and I reviewed the results:     IMPRESSION: Unremarkable MRI scan of the brain without contrast showing only mild age-appropriate changes of chronic small vessel disease.  Incidental changes of maxillary sinusitis small mucous retention cyst/polyp is noted in the right maxillary antrum.   They were contacted by phone regarding follow-up and regarding the brain MRI results.  They declined scheduling a follow-up appointment at the time.   Today, 08/14/2021: he  reports that his memory is no worse or better.  He provides very little information and answers in very short sentences.  He still drives, Calvin Salazar reports that his driving is impaired due to difficulty with direction, she uses the GPS when he drives even when they go to familiar places.  She feels that his memory has become worse.  She reports that he does not snore, they gave the ASV back last year, he has not used it, she reports that he will not use it and he reports that he would not want to go through another sleep study or use a CPAP like machine again.  We talked about his MRI results.  They saw his primary care recently and he continues to take generic Aricept 10 mg daily per PCP.  They were reportedly encouraged to make a follow-up appointment with me.  He reports that he hydrates well with water, unclear how much water he drinks.  He drinks about a quart of milk per day.  He may drink 1 serving of soda per day, very occasional alcohol.  He does not smoke.   The patient's allergies, current medications, family history, past medical history, past social history, past surgical history and problem list were reviewed and updated as appropriate.    Previously:     I saw him on 04/02/2020, at which time he reported no significant memory related concerns.  Calvin Salazar reported that his kids were concerned that he was more repetitive and forgetful.  Memory loss was going  on for about a year.  He had been started on Aricept about 6 months prior.   He was struggling with his ASV, but slept well the night prior to his visit.         I saw him on 01/24/2020, at which time we talked about his sleep study results. He Was on auto BiPAP at the time and had difficulty with his sleep.  He had residual sleep disordered breathing.  He was advised to proceed with a sleep study for consideration of ASV treatment.  He has an interim BiPAP titration study on 02/07/2020.  He was started on BiPAP of 12/8 and titrated to 15/10  centimeters.  He was switched to BiPAP ST but had elevated AHI on BiPAP ST.  He was switched to ASV with an EPAP of 10 cm which was then increased to 12 cm.  His final settings were EPAP of 10 cm, maximum pressure support of 10 cm, minimum pressure support of 3 cm.  He was started on BiPAP treatment, set up date was 03/26/2020.   I first met him at the request of his primary care physician on 08/14/2027.  He was advised to proceed with a sleep study.  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  He was advised to return for a full night titration study.  He had his titration study on 10/02/2019.  He was initiated on CPAP therapy but did not tolerate CPAP and was switched to BiPAP standard therapy at 8/4 centimeters but had persistent central apneas his sleep efficiency was low and he did not achieve sleep for more than a few minutes at a time, BiPAP ST was tried with a backup rate of 12/min and he was also briefly tried on ASV but did not sleep very long on it at all.  Overall, his titration study was not successful.  An optimal pressure setting and modality was not determined.  He was advised to start auto BiPAP therapy at home.  Set up date was 11/21/2019.    I reviewed his auto BiPAP compliance data from 12/24/2019 through 01/22/2020, which is a total of 30 days, during which time he used his machine 25 days with percent used days greater than 4 hours at 0 percent, indicating significantly suboptimal compliance with an average usage of 1 hour and 41 minutes for days on treatment, residual AHI high at 35.1/h, leak acceptable with a 95th percentile at 9.6 L/min on a maximum IPAP of 16, minimum EPAP of 4 and pressure support of 4.  Average pressure of approximately 13/9 centimeters, residual events appear to be primarily central in nature.    08/14/19: (He) reports no significant sleep-related concerns.  He is not  sure, if he snores. I reviewed your office note from 07/19/2019.  His MMSE was 28 out of 30 at the time.  His Epworth sleepiness score is 6 out of 24, fatigue severity score is 29 out of 63.  His A1c was above goal at 8.6 on 07/19/2019. He lives with his long-term girlfriend who has not complained about his snoring.  He is a retired Clinical biochemist.  He does not keep a very set schedule for his sleep time and rise time, generally in bed probably between 8 and 9 PM.  He watches TV in the bedroom and turns the TV off when he is ready to fall asleep.  He  typically gets out of bed between 8 and 9.  He does not have night to night nocturia and denies any morning headaches.  He is not aware of any family history of OSA.  His weight has fluctuated a little bit, he gained about 5 pounds in the past year.  He is a non-smoker, he does not drink caffeine daily, he drinks alcohol twice a week on average.  He does not take a nap every day, but occasionally.     ROS:   14 system review of systems performed and negative with exception of ***  PMH:  Past Medical History:  Diagnosis Date   Arthritis    Dyslipidemia    Family history of colon cancer    Frequency of urination    History of Salazar stones    History of urinary retention    03/ 2017 due to Salazar stone obstruction   Hx of adenomatous colonic polyps 03/11/2015   tubular adenoma's   Pre-diabetes    Prostate cancer ([place]) UROLOGIST- DR MCKENZIE/  ONCOLOGIST-- DR Tammi Klippel   dx 01/ 2018,  Stage T1c,  Gleason 3+3, PSA 6.9, vol 31.7cc    PSH:  Past Surgical History:  Procedure Laterality Date   COLONOSCOPY  last one 03-11-2015   CYSTOSCOPY  08/17/2016   Procedure: CYSTOSCOPY FLEXIBLE;  Surgeon: Cleon Gustin, MD;  Location: [facility];  Service: Urology;;  no seeds found in bladder   PROSTATE BIOPSY  05/05/2016   RADIOACTIVE SEED IMPLANT N/A 08/17/2016   Procedure: RADIOACTIVE SEED IMPLANT/BRACHYTHERAPY IMPLANT;  Surgeon: Cleon Gustin, MD;  Location: [facility];  Service: Urology;  Laterality: N/A;   87 seeds implanted    Social History:  Social History   Socioeconomic History   Marital status: Unknown    Spouse name: Not on file   Number of children: Not on file   Years of education: Not on file   Highest education level: Not on file  Occupational History   Not on file  Tobacco Use   Smoking status: Never   Smokeless tobacco: Never  Substance and Sexual Activity   Alcohol use: Yes    Alcohol/week: 0.0 standard drinks of alcohol    Comment: occasionally   Drug use: No   Sexual activity: Not on file  Other Topics Concern   Not on file  Social History Narrative   Not on file   Social Determinants of Health   Financial Resource Strain: Not on file  Food Insecurity: Not on file  Transportation Needs: Not on file  Physical Activity: Not on file  Stress: Not on file  Social Connections: Not on file  Intimate Partner Violence: Not on file    Family History:  Family History  Problem Relation Age of Onset   Colon cancer Mother 64       intestines   Colon cancer Father 50   Diabetes Maternal Grandmother    Alzheimer's disease Neg Hx    Dementia Neg Hx     Medications:   Current Outpatient Medications on File Prior to Visit  Medication Sig Dispense Refill   aspirin EC 81 MG tablet Take 81 mg by mouth daily.     CINNAMON PO Take 1 capsule by mouth daily.      donepezil (ARICEPT) 10 MG tablet Take 10 mg by mouth at bedtime.     memantine (NAMENDA) 10 MG tablet Take 0.5 tablets (5 mg total) by mouth 2 (two) times daily. [place]  tablet 5   metFORMIN (GLUCOPHAGE) 1000 MG tablet Take 1,000 mg by mouth 2 (two) times daily with a meal.     rosuvastatin (CRESTOR) 10 MG tablet Take 10 mg by mouth daily.     Semaglutide (RYBELSUS) 7 MG TABS Take by mouth.     Semaglutide, 1 MG/DOSE, (OZEMPIC, 1 MG/DOSE,) 4 MG/3ML SOPN INJECT $RemoveBef'1MG'[id number]$  SUBCUTANEOUS ONCE A WEEK     Current Facility-Administered  Medications on File Prior to Visit  Medication Dose Route Frequency Provider Last Rate Last Admin   ceFAZolin (ANCEF) IVPB 1 g/50 mL premix  1 g Intravenous Q8H McKenzie, Candee Furbish, MD        Allergies:  No Known Allergies    OBJECTIVE:  Physical Exam  There were no vitals filed for this visit. There is no height or weight on file to calculate BMI. No results found.   General: well developed, well nourished, seated, in no evident distress Head: head normocephalic and atraumatic.   Neck: supple with no carotid or supraclavicular bruits Cardiovascular: regular rate and rhythm, no murmurs Musculoskeletal: no deformity Skin:  no rash/petichiae Vascular:  Normal pulses all extremities   Neurologic Exam Mental Status: Awake and fully alert. Oriented to place and time. Recent and remote memory intact. Attention span, concentration and fund of knowledge appropriate. Mood and affect appropriate.     08/14/2021    9:49 AM 04/02/2020    2:03 PM  MMSE - Mini Mental State Exam  Orientation to time 3 5  Orientation to Place 5 4  Registration 3 3  Attention/ Calculation 1 1  Recall 1 2  Language- name 2 objects 2 2  Language- repeat 1 1  Language- follow 3 step command 1 3  Language- read & follow direction 1 1  Write a sentence 1 1  Copy design 1 1  Total score 20 24    Cranial Nerves: Pupils equal, briskly reactive to light. Extraocular movements full without nystagmus. Visual fields full to confrontation. Hearing intact. Facial sensation intact. Face, tongue, palate moves normally and symmetrically.  Motor: Normal bulk and tone. Normal strength in all tested extremity muscles Sensory.: intact to touch , pinprick , position and vibratory sensation.  Coordination: Rapid alternating movements normal in all extremities. Finger-to-nose and heel-to-shin performed accurately bilaterally. Gait and Station: Arises from chair without difficulty. Stance is normal. Gait demonstrates normal  stride length and balance without use of AD. Tandem walk and heel toe without difficulty.  Reflexes: 1+ and symmetric. Toes downgoing.         ASSESSMENT/PLAN: Calvin Salazar is a 71 y.o. year old male    1.  Memory loss   Continue Namenda  Continue Aricept 10 mg nightly        Follow up in *** or call earlier if needed   CC:  PCP: Ginger Organ., MD    I spent *** minutes of face-to-face and non-face-to-face time with patient.  This included previsit chart review, lab review, study review, order entry, electronic health record documentation, patient education regarding ***   Frann Rider, AGNP-BC  [facility] [address] [place], [place] [address]  Phone [phone] Fax [phone] Note: This document was prepared with digital dictation and possible smart phrase technology. Any transcriptional errors that result from this process are unintentional.

## 2021-11-27 ENCOUNTER — Ambulatory Visit: Payer: Medicare Other | Admitting: Adult Health

## 2021-11-27 ENCOUNTER — Encounter: Payer: Self-pay | Admitting: Adult Health

## 2021-11-27 ENCOUNTER — Telehealth: Payer: Self-pay | Admitting: Adult Health

## 2021-11-27 VITALS — BP 125/73 | HR 63 | Ht 68.0 in | Wt 242.0 lb

## 2021-11-27 DIAGNOSIS — R413 Other amnesia: Secondary | ICD-10-CM | POA: Diagnosis not present

## 2021-11-27 DIAGNOSIS — G4731 Primary central sleep apnea: Secondary | ICD-10-CM

## 2021-11-27 MED ORDER — MEMANTINE HCL 10 MG PO TABS: 10.0000 mg | ORAL_TABLET | Freq: Two times a day (BID) | ORAL | 5 refills | Status: AC

## 2021-11-27 NOTE — Patient Instructions (Signed)
Increase Namenda to '10mg'$  twice daily   Continue Aricept 10 mg daily  You will be called to schedule a neurocognitive evaluation         Thank you for coming to see [place] at [facility]. I hope we have been able to provide you high quality care today.  You may receive a patient satisfaction survey over the next few weeks. We would appreciate your feedback and comments so that we may continue to improve ourselves and the health of our patients.

## 2021-11-27 NOTE — Telephone Encounter (Signed)
Referral sent to [facility] [phone]. ?

## 2021-11-28 LAB — DEMENTIA PANEL
Homocysteine: 15.3 umol/L (ref 0.0–19.2)
RPR Ser Ql: NONREACTIVE
TSH: 2.11 u[IU]/mL (ref 0.450–4.500)
Vitamin B-12: 299 pg/mL (ref 232–1245)

## 2021-11-28 LAB — VITAMIN D 25 HYDROXY (VIT D DEFICIENCY, FRACTURES): Vit D, 25-Hydroxy: 34.5 ng/mL (ref 30.0–100.0)

## 2021-12-01 ENCOUNTER — Telehealth: Payer: Self-pay

## 2021-12-01 ENCOUNTER — Other Ambulatory Visit: Payer: Self-pay | Admitting: Adult Health

## 2021-12-01 MED ORDER — VITAMIN B-12 1000 MCG PO TABS: 1000.0000 ug | ORAL_TABLET | Freq: Every day | ORAL | Status: DC

## 2021-12-01 NOTE — Telephone Encounter (Signed)
Contacted pt spouse per DPR, informed of results. Number proved to call back with questions as she had none at this time and was appreciative.

## 2021-12-01 NOTE — Telephone Encounter (Signed)
-----   Message from Frann Rider, NP sent at 12/01/2021  7:27 AM EDT ----- Please advise patient/wife that recent lab work showed slightly low vitamin B level at 299 and recommend initiating B12 supplement 1022mg daily which can be purchased over-the-counter. Recommend ongoing monitoring and management by PCP.  All other lab work satisfactory.  Thank you!

## 2021-12-02 ENCOUNTER — Other Ambulatory Visit: Payer: Self-pay | Admitting: Neurology

## 2021-12-16 ENCOUNTER — Telehealth: Payer: Self-pay | Admitting: Adult Health

## 2021-12-16 NOTE — Telephone Encounter (Signed)
Can someone contact wife to get more information? Thank you.

## 2021-12-16 NOTE — Telephone Encounter (Signed)
I called and spoke to [facility] and gave her [facility] phone number. She asked me to have Calvin Salazar call her, she would not give me more information.

## 2021-12-16 NOTE — Telephone Encounter (Signed)
Pt's partner, Loran Senters (no DPR) would like call back to discuss the setting appt for his memory.

## 2021-12-16 NOTE — Telephone Encounter (Signed)
Contact wife back. Questions answered to best of my ability. Has long standing issue with tremors, she was encouraged to call our office back to speak with phone room to see who she previously saw in our office for tremor and possibly be able to schedule follow up visit. Advised if over 3 years from prior visit, she will need PCP to place new referral.  She verbalized understanding and appreciative of call.

## 2021-12-16 NOTE — Telephone Encounter (Signed)
I called patient's significant other, Mary, per [facility]  She refused to speak with me.  She reports that she spoke with Janett Billow about something personal and would only like to speak with Janett Billow.  I asked her what her question was regarding and she said it was regarding "me."  I advised her that I would pass along this message to Janett Billow, NP.

## 2022-07-29 ENCOUNTER — Other Ambulatory Visit (HOSPITAL_COMMUNITY): Payer: Self-pay | Admitting: Urology

## 2022-07-29 DIAGNOSIS — R9721 Rising PSA following treatment for malignant neoplasm of prostate: Secondary | ICD-10-CM

## 2022-07-29 DIAGNOSIS — C61 Malignant neoplasm of prostate: Secondary | ICD-10-CM

## 2022-09-01 ENCOUNTER — Encounter (HOSPITAL_COMMUNITY)
Admission: RE | Admit: 2022-09-01 | Discharge: 2022-09-01 | Disposition: A | Discharge status: Home / Self Care | Payer: Medicare Other | Source: Ambulatory Visit | Attending: Urology | Admitting: Urology

## 2022-09-01 DIAGNOSIS — C61 Malignant neoplasm of prostate: Secondary | ICD-10-CM

## 2022-09-01 DIAGNOSIS — R9721 Rising PSA following treatment for malignant neoplasm of prostate: Secondary | ICD-10-CM | POA: Diagnosis not present

## 2022-09-01 MED ORDER — PIFLIFOLASTAT F 18 (PYLARIFY) INJECTION
9.0000 | Freq: Once | INTRAVENOUS | Status: AC
  Administered 2022-09-01: 9.5 via INTRAVENOUS

## 2022-10-08 ENCOUNTER — Telehealth: Payer: Self-pay | Admitting: Radiation Oncology

## 2022-10-08 NOTE — Telephone Encounter (Signed)
Called patient to schedule a consultation w. Dr. Manning. No answer, LVM for a return call.  ?

## 2022-10-19 NOTE — Progress Notes (Signed)
GU Location of Tumor / Histology: Prostate Ca  Biopsy on 05/21/2016 showed Gleason Score is (3 + 3) on 05/14/2016 In 10/12 cores 5-70 % of core.  Had Brachytherapy (2018)  PSA is (3.723 06/25/2022)     09/01/2022 Dr. Karoline Caldwell NM PET(PSMA) Skull to Mid Thigh CLINICAL DATA:  Elevated PSA. Brachytherapy 2018 for prostate carcinoma. PSA equal 3.72.  FINDINGS: NECK No radiotracer activity in neck lymph nodes.   Incidental CT finding: None.   CHEST No radiotracer accumulation within mediastinal or hilar lymph nodes. No suspicious pulmonary nodules on the CT scan.   Incidental CT finding: None.   ABDOMEN/PELVIS Prostate: Brachytherapy seeds throughout the prostate gland. At the base of the prostate gland at the upper margin of the brachytherapy seeds there is a focus of radiotracer activity midline at the junction of the base and the Seminal vesicles SUV max equal 8.8 on image 211   Lymph nodes: No abnormal radiotracer accumulation within pelvic or abdominal nodes.   Liver: No evidence of liver metastasis.   Incidental CT finding: Peripelvic cysts on the  LEFT.   SKELETON   No focal activity to suggest skeletal metastasis.   IMPRESSION: 1. Focal radiotracer activity at the junction of seminal vesicles and the midline base of the prostate gland is concerning for local prostate carcinoma recurrence. 2. No evidence of metastatic adenopathy in the pelvis or periaortic retroperitoneum. 3. No visceral metastasis or skeletal metastasis.    Past/Anticipated interventions by urology, if any:   Past/Anticipated interventions by medical oncology, if any:   IPSS: SHIM:  Weight changes, if any: {:18581}  Bowel/Bladder complaints, if any: {:18581}   Nausea/Vomiting, if any: {:18581}  Pain issues, if any:  {:18581}  SAFETY ISSUES: Prior radiation? Yes, prostate cancer Brachytherapy (2018). Pacemaker/ICD? {:18581} Possible current pregnancy? Male Is the patient on  methotrexate? {:18581}  Current Complaints / other details:  ***

## 2022-10-20 ENCOUNTER — Other Ambulatory Visit: Payer: Self-pay

## 2022-10-20 ENCOUNTER — Encounter: Payer: Self-pay | Admitting: Radiation Oncology

## 2022-10-20 ENCOUNTER — Ambulatory Visit
Admission: RE | Admit: 2022-10-20 | Discharge: 2022-10-20 | Disposition: A | Discharge status: Home / Self Care | Payer: Medicare Other | Source: Ambulatory Visit | Attending: Radiation Oncology | Admitting: Radiation Oncology

## 2022-10-20 VITALS — BP 124/80 | HR 62 | Temp 97.3°F | Resp 18 | Ht 68.0 in | Wt 230.0 lb

## 2022-10-20 DIAGNOSIS — M129 Arthropathy, unspecified: Secondary | ICD-10-CM | POA: Insufficient documentation

## 2022-10-20 DIAGNOSIS — Z923 Personal history of irradiation: Secondary | ICD-10-CM | POA: Diagnosis not present

## 2022-10-20 DIAGNOSIS — Z79899 Other long term (current) drug therapy: Secondary | ICD-10-CM | POA: Diagnosis not present

## 2022-10-20 DIAGNOSIS — Z86010 Personal history of colonic polyps: Secondary | ICD-10-CM | POA: Diagnosis not present

## 2022-10-20 DIAGNOSIS — Z87442 Personal history of urinary calculi: Secondary | ICD-10-CM | POA: Diagnosis not present

## 2022-10-20 DIAGNOSIS — Z7982 Long term (current) use of aspirin: Secondary | ICD-10-CM | POA: Insufficient documentation

## 2022-10-20 DIAGNOSIS — C61 Malignant neoplasm of prostate: Secondary | ICD-10-CM

## 2022-10-20 DIAGNOSIS — E785 Hyperlipidemia, unspecified: Secondary | ICD-10-CM | POA: Insufficient documentation

## 2022-10-20 DIAGNOSIS — Z800 Family history of malignant neoplasm of digestive organs: Secondary | ICD-10-CM | POA: Insufficient documentation

## 2022-10-20 DIAGNOSIS — Z7984 Long term (current) use of oral hypoglycemic drugs: Secondary | ICD-10-CM | POA: Insufficient documentation

## 2022-10-20 NOTE — Progress Notes (Signed)
Radiation Oncology         [phone]) [phone] ________________________________  Outpatient Consultation  Name: Calvin Salazar MRN: [id number]  Date of Service: 10/20/2022 DOB: [birth year]  QM:VHQI, Netta Corrigan., MD  Belva Agee, MD   REFERRING PHYSICIAN: Belva Agee, MD  DIAGNOSIS: 72 y/o man with locally recurrent prostate cancer s/p brachytherapy in [address] for Gleason 3+3 adenocarcinoma of the prostate    ICD-10-CM   1. Malignant neoplasm of prostate (HCC)  C61       HISTORY OF PRESENT ILLNESS: Calvin Salazar is a 72 y.o. male seen at the request of Dr. Benancio Deeds. He has a history of Gleason 3+3 prostate cancer in 10/12 cores on prostate biopsy on 05/05/16 with Dr. Ronne Binning. His pretreatment PSA was 8.2. He elected to proceed with brachytherapy seed implant which was performed on 08/17/16. His brachytherapy implant was successful and his PSA reached a nadir of 0.16 in 09/2019.  He has continued in close surveillance under the care of Dr. Benancio Deeds since 2021. His PSA has been gradually rising since 02/2020 when it was 0.33, increasing to 1.06 in 06/2021 and most recently up to 3.72 in 06/2022. This prompted further evaluation with a PSMA PET scan that was performed on 09/04/22 showing focal radiotracer activity at the junction of the seminal vesicles and midline base of prostate gland without evidence of metastatic adenopathy, visceral metastasis, or skeletal metastasis. This appears consistent with local disease recurrence.    He has reviewed the PSA and PSMA PET results with his urologist and has been kindly referred to [place] today for consideration of salvage radiation therapy to the area of local recurrence.  PREVIOUS RADIATION THERAPY: Yes  06/08/16: Brachytherapy, prostate / 145 Gy  PAST MEDICAL HISTORY:  Past Medical History:  Diagnosis Date   Arthritis    Dyslipidemia    Family history of colon cancer    Frequency of urination    History of kidney stones    History of urinary  retention    03/ 2017 due to kidney stone obstruction   Hx of adenomatous colonic polyps 03/11/2015   tubular adenoma's   Pre-diabetes    Prostate cancer (HCC) UROLOGIST- DR MCKENZIE/  ONCOLOGIST-- DR Kathrynn Running   dx 01/ 2018,  Stage T1c,  Gleason 3+3, PSA 6.9, vol 31.7cc      PAST SURGICAL HISTORY: Past Surgical History:  Procedure Laterality Date   COLONOSCOPY  last one 03-11-2015   CYSTOSCOPY  08/17/2016   Procedure: CYSTOSCOPY FLEXIBLE;  Surgeon: Malen Gauze, MD;  Location: [facility];  Service: Urology;;  no seeds found in bladder   PROSTATE BIOPSY  05/05/2016   RADIOACTIVE SEED IMPLANT N/A 08/17/2016   Procedure: RADIOACTIVE SEED IMPLANT/BRACHYTHERAPY IMPLANT;  Surgeon: Malen Gauze, MD;  Location: [facility];  Service: Urology;  Laterality: N/A;   76 seeds implanted    FAMILY HISTORY:  Family History  Problem Relation Age of Onset   Colon cancer Mother 19       intestines   Colon cancer Father 103   Diabetes Maternal Grandmother    Alzheimer's disease Neg Hx    Dementia Neg Hx     SOCIAL HISTORY:  Social History   Socioeconomic History   Marital status: Unknown    Spouse name: Not on file   Number of children: Not on file   Years of education: Not on file   Highest education level: Not on file  Occupational History   Not  on file  Tobacco Use   Smoking status: Never   Smokeless tobacco: Never  Vaping Use   Vaping Use: Never used  Substance and Sexual Activity   Alcohol use: Yes    Alcohol/week: 0.0 standard drinks of alcohol    Comment: occasionally   Drug use: No   Sexual activity: Not on file  Other Topics Concern   Not on file  Social History Narrative   Not on file   Social Determinants of Health   Financial Resource Strain: Not on file  Food Insecurity: No Food Insecurity (10/20/2022)   Hunger Vital Sign    Worried About Running Out of Food in the Last Year: Never true    Ran Out of Food in the Last Year:  Never true  Transportation Needs: No Transportation Needs (10/20/2022)   PRAPARE - Administrator, Civil Service (Medical): No    Lack of Transportation (Non-Medical): No  Physical Activity: Not on file  Stress: Not on file  Social Connections: Not on file  Intimate Partner Violence: Not At Risk (10/20/2022)   Humiliation, Afraid, Rape, and Kick questionnaire    Fear of Current or Ex-Partner: No    Emotionally Abused: No    Physically Abused: No    Sexually Abused: No    ALLERGIES: Patient has no known allergies.  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     donepezil (ARICEPT) 10 MG tablet Take 10 mg by mouth at bedtime.     memantine (NAMENDA) 10 MG tablet Take 1 tablet (10 mg total) by mouth 2 (two) times daily. 60 tablet 5   metFORMIN (GLUCOPHAGE) 1000 MG tablet Take 1,000 mg by mouth 2 (two) times daily with a meal.     rosuvastatin (CRESTOR) 10 MG tablet Take 10 mg by mouth daily.     Semaglutide, 1 MG/DOSE, (OZEMPIC, 1 MG/DOSE,) 4 MG/3ML SOPN INJECT 1MG  SUBCUTANEOUS ONCE A WEEK     Current Facility-Administered Medications  Medication Dose Route Frequency Provider Last Rate Last Admin   ceFAZolin (ANCEF) IVPB 1 g/50 mL premix  1 g Intravenous Q8H McKenzie, Mardene Celeste, MD        REVIEW OF SYSTEMS:  On review of systems, the patient reports that he is doing well overall. He denies any chest pain, shortness of breath, cough, fevers, chills, night sweats, unintended weight changes. He denies any bowel or bladder disturbances, and denies abdominal pain, nausea or vomiting. His IPSS was 0 indicating he does not have any lower urinary tract symptoms. He is not currently sexually active. He denies any new  musculoskeletal or joint aches or pains. A complete review of systems is obtained and is otherwise negative.    PHYSICAL EXAM:  Wt Readings from Last 3 Encounters:  10/20/22 230 lb (104.3 kg)  11/27/21 242 lb (109.8 kg)  08/14/21 243 lb 6.4 oz (110.4 kg)   Temp Readings from Last 3 Encounters:  10/20/22 (!) 97.3 F (36.3 C) (Temporal)  09/11/16 98.3 F (36.8 C) (Oral)  08/17/16 98 F (36.7 C)   BP Readings from Last 3 Encounters:  10/20/22 124/80  11/27/21 125/73  08/14/21 122/83  Pulse Readings from Last 3 Encounters:  10/20/22 62  11/27/21 63  08/14/21 78   Pain Assessment Pain Score: 0-No pain/10  In general this is a well appearing Caucasian man in no acute distress. He's alert and oriented x4 and appropriate throughout the examination. Cardiopulmonary assessment is negative for acute distress and he exhibits normal effort.     KPS = 100  100 - Normal; no complaints; no evidence of disease. 90   - Able to carry on normal activity; minor signs or symptoms of disease. 80   - Normal activity with effort; some signs or symptoms of disease. 61   - Cares for self; unable to carry on normal activity or to do active work. 60   - Requires occasional assistance, but is able to care for most of his personal needs. 50   - Requires considerable assistance and frequent medical care. 40   - Disabled; requires special care and assistance. 30   - Severely disabled; hospital admission is indicated although death not imminent. 20   - Very sick; hospital admission necessary; active supportive treatment necessary. 10   - Moribund; fatal processes progressing rapidly. 0     - Dead  Karnofsky DA, Abelmann WH, Craver LS and Burchenal [facility] [phone]) The use of the nitrogen mustards in the palliative treatment of carcinoma: with particular reference to bronchogenic carcinoma Cancer 1 [phone]  LABORATORY DATA:  Lab Results  Component Value Date   WBC 4.9 08/06/2016   HGB 13.8 08/06/2016    HCT 39.9 08/06/2016   MCV 85.1 08/06/2016   PLT 202 08/06/2016   Lab Results  Component Value Date   NA 138 08/06/2016   K 4.3 08/06/2016   CL 104 08/06/2016   CO2 27 08/06/2016   Lab Results  Component Value Date   ALT 16 (L) 08/06/2016   AST 20 08/06/2016   ALKPHOS 76 08/06/2016   BILITOT 0.7 08/06/2016     RADIOGRAPHY: No results found.    IMPRESSION/PLAN: 1. 72 y.o. man with locally recurrent prostate cancer s/p brachytherapy in [address] for Gleason 3+3 adenocarcinoma of the prostate  Today, we talked to the patient and his wife about the findings and workup thus far. We discussed the possibility of proceeding with repeat prostate biopsy for confirmation versus proceeding with treatment based on PSA and PSMA PET findings. We discussed the natural history of prostate cancer and general treatment, highlighting the role of radiotherapy in the management. We discussed the available radiation techniques, and focused on the details and logistics of delivery. The recommendation is for a 5 fraction course of stereotactic body radiotherapy (SBRT) to the PET positive disease in the seminal vesicles/base of prostate. We reviewed the anticipated acute and late sequelae associated with radiation in this setting. The patient was encouraged to ask questions that were answered to his stated satisfaction.  At the end of our conversation, the patient prefers to forego repeat biopsy and is in agreement to proceed with the recommended 5 fraction course of stereotactic body radiotherapy (SBRT) to the PET positive disease in the seminal vesicles/base of prostate. He has freely signed written consent to proceed today in the office and a copy of this document will be placed in his medical record. He and his wife will be leaving for a 3 week cruise, beginning tomorrow and returning on 11/18/22 so we have tentatively scheduled his CT SIM/treatment planning appointment at 8am on Friday 11/20/22, in anticipation of  beginning his treatments 12/07/22 since they will  be away for a long weekend 8/7- 12/06/22. They appear to have a good understanding of his disease and our treatment recommendations which are of curative intent and are comfortable and in agreement with  the stated plan. We will share our discussion with Dr. Benancio Deeds and proceed with treatment planning accordingly. We enjoyed meeting with them again today and look forward to continuing to participate in his care. They know that they are welcome to call at any time in the interim with any questions or concerns regarding the radiation treatment plan.  We personally spent 70 minutes in this encounter including chart review, reviewing radiological studies, meeting face-to-face with the patient, entering orders and completing documentation.    Marguarite Arbour, PA-C    Margaretmary Dys, MD  [facility]  Radiation Oncology Direct Dial: [phone]  Fax: [phone] [email or web address]  Skype  LinkedIn   This document serves as a record of services personally performed by Margaretmary Dys, MD and Marcello Fennel, PA-C. It was created on their behalf by Mickie Bail, a trained medical scribe. The creation of this record is based on the scribe's personal observations and the provider's statements to them. This document has been checked and approved by the attending provider.

## 2022-10-20 NOTE — Addendum Note (Signed)
Encounter addended by: Roel Cluck, RN on: 10/20/2022 10:19 AM  Actions taken: Charge Capture section accepted

## 2022-11-19 ENCOUNTER — Ambulatory Visit: Payer: Medicare Other | Admitting: Radiation Oncology

## 2022-11-19 NOTE — Progress Notes (Signed)
  Radiation Oncology         [phone] ________________________________  Name: Calvin Salazar MRN: [id number]  Date: 11/20/2022  DOB: 1951/03/20  STEREOTACTIC BODY RADIOTHERAPY  SIMULATION AND TREATMENT PLANNING NOTE    ICD-10-CM   1. Malignant neoplasm of prostate (HCC)  C61       DIAGNOSIS:  72 y/o man with locally recurrent prostate cancer with PET positive disease at the junction of the seminal vesicles and midline base of prostate gland s/p brachytherapy in 2018 for Gleason 3+3 adenocarcinoma of the prostate   NARRATIVE:  The patient was brought to the CT Simulation planning suite.  Identity was confirmed.  All relevant records and images related to the planned course of therapy were reviewed.  The patient freely provided informed written consent to proceed with treatment after reviewing the details related to the planned course of therapy. The consent form was witnessed and verified by the simulation staff.  Then, the patient was set-up in a stable reproducible  supine position for radiation therapy.  A BodyFix immobilization pillow was fabricated for reproducible positioning.  Surface markings were placed.  The CT images were loaded into the planning software.  The gross target volumes (GTV) and planning target volumes (PTV) were delinieated, and avoidance structures were contoured.  Treatment planning then occurred.  The radiation prescription was entered and confirmed.  A total of two complex treatment devices were fabricated in the form of the BodyFix immobilization pillow and a neck accuform cushion.  I have requested : 3D Simulation  I have requested a DVH of the following structures: targets and all normal structures near the target including rectum, bladder, femoral heads and others as noted on the radiation plan to maintain doses in adherence with established limits  SPECIAL TREATMENT PROCEDURE:  The planned course of therapy using radiation constitutes a special treatment  procedure. Special care is required in the management of this patient for the following reasons. High dose per fraction requiring special monitoring for increased toxicities of treatment including daily imaging..  The special nature of the planned course of radiotherapy will require increased physician supervision and oversight to ensure patient's safety with optimal treatment outcomes.    This requires extended time and effort.    PLAN:  The patient will receive 36.25 Gy in 5 fractions to the PET positive disease at the junction of the seminal vesicles and midline base of prostate gland.  ________________________________  Artist Pais. Kathrynn Running, M.D.

## 2022-11-20 ENCOUNTER — Other Ambulatory Visit: Payer: Self-pay

## 2022-11-20 ENCOUNTER — Ambulatory Visit
Admission: RE | Admit: 2022-11-20 | Discharge: 2022-11-20 | Disposition: A | Discharge status: Home / Self Care | Payer: Medicare Other | Source: Ambulatory Visit | Attending: Radiation Oncology | Admitting: Radiation Oncology

## 2022-11-20 DIAGNOSIS — C61 Malignant neoplasm of prostate: Secondary | ICD-10-CM | POA: Insufficient documentation

## 2022-11-24 DIAGNOSIS — C61 Malignant neoplasm of prostate: Secondary | ICD-10-CM | POA: Diagnosis not present

## 2022-12-01 ENCOUNTER — Other Ambulatory Visit: Payer: Self-pay

## 2022-12-01 ENCOUNTER — Ambulatory Visit
Admission: RE | Admit: 2022-12-01 | Discharge: 2022-12-01 | Disposition: A | Discharge status: Home / Self Care | Payer: Medicare Other | Source: Ambulatory Visit | Attending: Radiation Oncology | Admitting: Radiation Oncology

## 2022-12-01 DIAGNOSIS — C61 Malignant neoplasm of prostate: Secondary | ICD-10-CM | POA: Insufficient documentation

## 2022-12-01 LAB — RAD ONC ARIA SESSION SUMMARY
Course Elapsed Days: 0
Plan Fractions Treated to Date: 1
Plan Prescribed Dose Per Fraction: 7.25 Gy
Plan Total Fractions Prescribed: 5
Plan Total Prescribed Dose: 36.25 Gy
Reference Point Dosage Given to Date: 7.25 Gy
Reference Point Session Dosage Given: 7.25 Gy
Session Number: 1

## 2022-12-02 ENCOUNTER — Ambulatory Visit: Payer: Medicare Other

## 2022-12-03 ENCOUNTER — Other Ambulatory Visit: Payer: Self-pay

## 2022-12-03 ENCOUNTER — Ambulatory Visit: Payer: Medicare Other

## 2022-12-03 ENCOUNTER — Ambulatory Visit
Admission: RE | Admit: 2022-12-03 | Discharge: 2022-12-03 | Disposition: A | Discharge status: Home / Self Care | Payer: Medicare Other | Source: Ambulatory Visit | Attending: Radiation Oncology | Admitting: Radiation Oncology

## 2022-12-03 DIAGNOSIS — C61 Malignant neoplasm of prostate: Secondary | ICD-10-CM | POA: Diagnosis not present

## 2022-12-03 LAB — RAD ONC ARIA SESSION SUMMARY
Course Elapsed Days: 2
Plan Fractions Treated to Date: 2
Plan Prescribed Dose Per Fraction: 7.25 Gy
Plan Total Fractions Prescribed: 5
Plan Total Prescribed Dose: 36.25 Gy
Reference Point Dosage Given to Date: 14.5 Gy
Reference Point Session Dosage Given: 7.25 Gy
Session Number: 2

## 2022-12-04 ENCOUNTER — Ambulatory Visit: Payer: Medicare Other

## 2022-12-07 ENCOUNTER — Telehealth: Payer: Self-pay | Admitting: Radiation Oncology

## 2022-12-07 ENCOUNTER — Other Ambulatory Visit: Payer: Self-pay

## 2022-12-07 ENCOUNTER — Ambulatory Visit
Admission: RE | Admit: 2022-12-07 | Discharge: 2022-12-07 | Disposition: A | Discharge status: Home / Self Care | Payer: Medicare Other | Source: Ambulatory Visit | Attending: Radiation Oncology | Admitting: Radiation Oncology

## 2022-12-07 DIAGNOSIS — C61 Malignant neoplasm of prostate: Secondary | ICD-10-CM | POA: Diagnosis not present

## 2022-12-07 LAB — RAD ONC ARIA SESSION SUMMARY
Course Elapsed Days: 6
Plan Fractions Treated to Date: 3
Plan Prescribed Dose Per Fraction: 7.25 Gy
Plan Total Fractions Prescribed: 5
Plan Total Prescribed Dose: 36.25 Gy
Reference Point Dosage Given to Date: 21.75 Gy
Reference Point Session Dosage Given: 7.25 Gy
Session Number: 3

## 2022-12-07 NOTE — Telephone Encounter (Signed)
8/12 @ 8:35 am Patient left voicemail.  He stated he was returning a call he missed.  Reach out to L1 machine and no answer then reach out to [place] (Support RTT).  Marchelle Folks stated that L1 machine was temporary down was reaching out patient to reschedule him at a later time.  Reach out to patient so he is aware.  Patient and friend had already arrived for his appointment, called L1 machine spoke to [place], so they are aware.

## 2022-12-09 ENCOUNTER — Ambulatory Visit: Admission: RE | Admit: 2022-12-09 | Payer: Medicare Other | Source: Ambulatory Visit

## 2022-12-09 ENCOUNTER — Other Ambulatory Visit: Payer: Self-pay

## 2022-12-09 DIAGNOSIS — C61 Malignant neoplasm of prostate: Secondary | ICD-10-CM | POA: Diagnosis not present

## 2022-12-09 LAB — RAD ONC ARIA SESSION SUMMARY
Course Elapsed Days: 8
Plan Fractions Treated to Date: 4
Plan Prescribed Dose Per Fraction: 7.25 Gy
Plan Total Fractions Prescribed: 5
Plan Total Prescribed Dose: 36.25 Gy
Reference Point Dosage Given to Date: 29 Gy
Reference Point Session Dosage Given: 7.25 Gy
Session Number: 4

## 2022-12-11 ENCOUNTER — Ambulatory Visit: Payer: Medicare Other

## 2022-12-14 ENCOUNTER — Other Ambulatory Visit: Payer: Self-pay

## 2022-12-14 ENCOUNTER — Ambulatory Visit: Admission: RE | Admit: 2022-12-14 | Payer: Medicare Other | Source: Ambulatory Visit

## 2022-12-14 ENCOUNTER — Ambulatory Visit
Admission: RE | Admit: 2022-12-14 | Discharge: 2022-12-14 | Disposition: A | Discharge status: Home / Self Care | Payer: Medicare Other | Source: Ambulatory Visit | Attending: Radiation Oncology | Admitting: Radiation Oncology

## 2022-12-14 DIAGNOSIS — C61 Malignant neoplasm of prostate: Secondary | ICD-10-CM | POA: Diagnosis not present

## 2022-12-14 LAB — RAD ONC ARIA SESSION SUMMARY
Course Elapsed Days: 13
Plan Fractions Treated to Date: 5
Plan Prescribed Dose Per Fraction: 7.25 Gy
Plan Total Fractions Prescribed: 5
Plan Total Prescribed Dose: 36.25 Gy
Reference Point Dosage Given to Date: 36.25 Gy
Reference Point Session Dosage Given: 7.25 Gy
Session Number: 5

## 2022-12-15 NOTE — Radiation Completion Notes (Addendum)
  Radiation Oncology         [phone] ________________________________  Name: Calvin Salazar MRN: [id number]  Date: 12/14/2022  DOB: January 29, 1951  Referring Physician: Karoline Caldwell, M.D. Date of Service: 2022-12-15 Radiation Oncologist: Margaretmary Bayley, M.D. [facility]     RADIATION ONCOLOGY END OF TREATMENT NOTE     Diagnosis: 72 y/o man with locally recurrent prostate cancer s/p brachytherapy in [address] for Gleason 3+3 adenocarcinoma of the prostate   Intent: Curative     ==========DELIVERED PLANS==========  First Treatment Date: 2022-12-01 - Last Treatment Date: 2022-12-14   Plan Name: Prostate_SBRT Site: Prostate Technique: SBRT/SRT-IMRT Mode: Photon Dose Per Fraction: 7.25 Gy Prescribed Dose (Delivered / Prescribed): 36.25 Gy / 36.25 Gy Prescribed Fxs (Delivered / Prescribed): 5 / 5     ==========ON TREATMENT VISIT DATES========== 2022-12-01, 2022-12-03, 2022-12-07, 2022-12-09, 2022-12-14    See weekly On Treatment Notes in Epic for details.  He tolerated the radiation treatments relatively well with only modest fatigue.  The patient will receive a call in about one month from the radiation oncology department. He will continue follow up with his urologist, Dr. Lafonda Mosses, as well.  ------------------------------------------------   Margaretmary Dys, MD [facility]  Radiation Oncology Direct Dial: [phone]  Fax: [phone] [email or web address]  Skype  LinkedIn

## 2022-12-21 ENCOUNTER — Telehealth: Payer: Self-pay

## 2022-12-21 NOTE — Telephone Encounter (Signed)
RN returned call to Jasmine December (significant other) Karleen Hampshire in reference to what's next for Mr. Calvin Salazar since he completed his SBRT/prostate radiation treatment on 12/14/2022.  Left voicemail for next steps he needs to follow up with his urologist and will be receiving post treatment call for our office on 01/26/2023.

## 2022-12-31 ENCOUNTER — Telehealth: Payer: Self-pay

## 2022-12-31 NOTE — Telephone Encounter (Signed)
RN returned call about patient wanting to get Covid shot.  He his good to get shot.

## 2023-01-07 ENCOUNTER — Other Ambulatory Visit: Payer: Self-pay | Admitting: Urology

## 2023-01-07 DIAGNOSIS — C61 Malignant neoplasm of prostate: Secondary | ICD-10-CM

## 2023-01-19 ENCOUNTER — Encounter: Payer: Self-pay | Admitting: *Deleted

## 2023-01-26 ENCOUNTER — Ambulatory Visit
Admission: RE | Admit: 2023-01-26 | Discharge: 2023-01-26 | Disposition: A | Discharge status: Home / Self Care | Payer: Medicare Other | Source: Ambulatory Visit | Attending: Internal Medicine | Admitting: Internal Medicine

## 2023-02-18 ENCOUNTER — Encounter: Payer: Self-pay | Admitting: *Deleted

## 2023-02-18 ENCOUNTER — Inpatient Hospital Stay: Payer: Medicare Other | Attending: Adult Health | Admitting: *Deleted

## 2023-02-18 DIAGNOSIS — C61 Malignant neoplasm of prostate: Secondary | ICD-10-CM

## 2023-02-18 NOTE — Progress Notes (Signed)
  SCP reviewed and completed. Pt will have post-tx PSA labs on Nov.5 at [facility]. Pt will be leaving for a cruise on Nov.9th.

## 2023-05-18 ENCOUNTER — Encounter: Payer: Self-pay | Admitting: Internal Medicine

## 2023-06-16 ENCOUNTER — Ambulatory Visit (AMBULATORY_SURGERY_CENTER): Payer: Medicare Other

## 2023-06-16 VITALS — Ht 68.0 in | Wt 233.0 lb

## 2023-06-16 DIAGNOSIS — Z800 Family history of malignant neoplasm of digestive organs: Secondary | ICD-10-CM

## 2023-06-16 DIAGNOSIS — Z860100 Personal history of colon polyps, unspecified: Secondary | ICD-10-CM

## 2023-06-16 MED ORDER — NA SULFATE-K SULFATE-MG SULF 17.5-3.13-1.6 GM/177ML PO SOLN: 1.0000 | Freq: Once | ORAL | 0 refills | Status: AC

## 2023-06-16 NOTE — Progress Notes (Signed)

## 2023-06-25 ENCOUNTER — Encounter: Payer: Self-pay | Admitting: Internal Medicine

## 2023-06-29 NOTE — Progress Notes (Unsigned)
 [facility] History and Physical   Primary Care Physician:  Cleatis Polka., MD   Reason for Procedure:  History of colon adenomas and a family history of colon cancer  Plan:    Colonoscopy     HPI: Calvin Salazar is a 73 y.o. male status post colonoscopy 2016 with removal of 2 diminutive adenomas.  Routine repeat was recommended in 2021 but has not been done yet.  There is a family history of colon cancer in his mother in her 105s and father in his 90s.   Past Medical History:  Diagnosis Date   Arthritis    Dyslipidemia    Family history of colon cancer    Frequency of urination    History of kidney stones    History of urinary retention    03/ 2017 due to kidney stone obstruction   Hx of adenomatous colonic polyps 03/11/2015   tubular adenoma's   Pre-diabetes    Prostate cancer (HCC) UROLOGIST- DR MCKENZIE/  ONCOLOGIST-- DR Kathrynn Running   dx 01/ 2018,  Stage T1c,  Gleason 3+3, PSA 6.9, vol 31.7cc    Past Surgical History:  Procedure Laterality Date   COLONOSCOPY  last one 03-11-2015   CYSTOSCOPY  08/17/2016   Procedure: CYSTOSCOPY FLEXIBLE;  Surgeon: Malen Gauze, MD;  Location[facility];  Service: Urology;;  no seeds found in bladder   PROSTATE BIOPSY  05/05/2016   RADIOACTIVE SEED IMPLANT N/A 08/17/2016   Procedure: RADIOACTIVE SEED IMPLANT/BRACHYTHERAPY IMPLANT;  Surgeon: Malen Gauze, MD;  Location: [facility];  Service: Urology;  Laterality: N/A;   76 seeds implanted    Prior to Admission medications   Medication Sig Start Date End Date Taking? Authorizing Provider  aspirin EC 81 MG tablet Take 81 mg by mouth daily.    [provider]  cyanocobalamin (VITAMIN B12) 1000 MCG tablet Take 1,000 mcg by mouth daily. Patient not taking: Reported on 06/16/2023    [provider]  donepezil (ARICEPT) 10 MG tablet Take 10 mg by mouth at bedtime.    [provider]  glucose blood (ONETOUCH  VERIO) test strip Pt to check blood sugar once a day  Dx:E11.9 In Vitro 05/25/16   [provider]  memantine (NAMENDA) 10 MG tablet Take 1 tablet (10 mg total) by mouth 2 (two) times daily. 11/27/21   Ihor Austin, NP  metFORMIN (GLUCOPHAGE) 1000 MG tablet Take 1,000 mg by mouth 2 (two) times daily with a meal.    [provider]  OneTouch Delica Lancets 33G MISC Pt to check Blood sugar once a day  Dx: Ell.9 05/25/16   [provider]  rosuvastatin (CRESTOR) 10 MG tablet Take 10 mg by mouth daily.    [provider]  Semaglutide, 1 MG/DOSE, (OZEMPIC, 1 MG/DOSE,) 4 MG/3ML SOPN INJECT 1MG  SUBCUTANEOUS ONCE A WEEK    [provider]    Current Outpatient Medications  Medication Sig Dispense Refill   aspirin EC 81 MG tablet Take 81 mg by mouth daily.     donepezil (ARICEPT) 10 MG tablet Take 10 mg by mouth at bedtime.     memantine (NAMENDA) 10 MG tablet Take 1 tablet (10 mg total) by mouth 2 (two) times daily. 60 tablet 5   metFORMIN (GLUCOPHAGE) 1000 MG tablet Take 1,000 mg by mouth 2 (two) times daily with a meal.     rosuvastatin (CRESTOR) 10 MG tablet Take 10 mg by mouth daily.  cyanocobalamin (VITAMIN B12) 1000 MCG tablet Take 1,000 mcg by mouth daily. (Patient not taking: Reported on 06/30/2023)     Semaglutide, 1 MG/DOSE, (OZEMPIC, 1 MG/DOSE,) 4 MG/3ML SOPN INJECT 1MG  SUBCUTANEOUS ONCE A WEEK     Current Facility-Administered Medications  Medication Dose Route Frequency Provider Last Rate Last Admin   0.9 %  sodium chloride infusion  500 mL Intravenous Continuous Iva Boop, MD       ceFAZolin (ANCEF) IVPB 1 g/50 mL premix  1 g Intravenous Q8H McKenzie, Mardene Celeste, MD        Allergies as of 06/30/2023   (No Known Allergies)    Family History  Problem Relation Age of Onset   Colon cancer Mother 1       intestines   Colon cancer Father 54   Diabetes Maternal Grandmother    Alzheimer's disease Neg Hx    Dementia Neg Hx    Rectal  cancer Neg Hx    Stomach cancer Neg Hx     Social History   Socioeconomic History   Marital status: Unknown    Spouse name: Not on file   Number of children: Not on file   Years of education: Not on file   Highest education level: Not on file  Occupational History   Not on file  Tobacco Use   Smoking status: Never   Smokeless tobacco: Never  Vaping Use   Vaping status: Never Used  Substance and Sexual Activity   Alcohol use: Yes    Alcohol/week: 0.0 standard drinks of alcohol    Comment: occasionally   Drug use: No   Sexual activity: Not on file  Other Topics Concern   Not on file  Social History Narrative   Not on file   Social Drivers of Health   Financial Resource Strain: Not on file  Food Insecurity: No Food Insecurity (10/20/2022)   Hunger Vital Sign    Worried About Running Out of Food in the Last Year: Never true    Ran Out of Food in the Last Year: Never true  Transportation Needs: No Transportation Needs (10/20/2022)   PRAPARE - Administrator, Civil Service (Medical): No    Lack of Transportation (Non-Medical): No  Physical Activity: Not on file  Stress: Not on file  Social Connections: Not on file  Intimate Partner Violence: Not At Risk (10/20/2022)   Humiliation, Afraid, Rape, and Kick questionnaire    Fear of Current or Ex-Partner: No    Emotionally Abused: No    Physically Abused: No    Sexually Abused: No    Review of Systems: All other review of systems negative except as mentioned in the HPI.  Physical Exam: Vital signs BP 116/63   Pulse 62   Temp 97.7 F (36.5 C) (Temporal)   Ht 5\' 8"  (1.727 m)   Wt 233 lb (105.7 kg)   SpO2 98%   BMI 35.43 kg/m   General:   Alert,  Well-developed, well-nourished, pleasant and cooperative in NAD Lungs:  Clear throughout to auscultation.   Heart:  Regular rate and rhythm; no murmurs, clicks, rubs,  or gallops. Abdomen:  Soft, nontender and nondistended. Normal bowel sounds.   Neuro/Psych:   Alert and cooperative. Normal mood and affect. A and O x 3   @Olive Motyka  Sena Slate, MD, [facility] [phone] (pager) 06/30/2023 10:23 AM@

## 2023-06-30 ENCOUNTER — Ambulatory Visit (AMBULATORY_SURGERY_CENTER): Payer: Medicare Other | Admitting: Internal Medicine

## 2023-06-30 ENCOUNTER — Encounter: Payer: Self-pay | Admitting: Internal Medicine

## 2023-06-30 VITALS — BP 134/67 | HR 59 | Temp 97.7°F | Resp 18 | Ht 68.0 in | Wt 233.0 lb

## 2023-06-30 DIAGNOSIS — K5730 Diverticulosis of large intestine without perforation or abscess without bleeding: Secondary | ICD-10-CM

## 2023-06-30 DIAGNOSIS — Z860101 Personal history of adenomatous and serrated colon polyps: Secondary | ICD-10-CM

## 2023-06-30 DIAGNOSIS — Z800 Family history of malignant neoplasm of digestive organs: Secondary | ICD-10-CM

## 2023-06-30 DIAGNOSIS — Z1211 Encounter for screening for malignant neoplasm of colon: Secondary | ICD-10-CM

## 2023-06-30 DIAGNOSIS — Z860100 Personal history of colon polyps, unspecified: Secondary | ICD-10-CM

## 2023-06-30 MED ORDER — SODIUM CHLORIDE 0.9 % IV SOLN: 500.0000 mL | INTRAVENOUS | Status: DC

## 2023-06-30 NOTE — Op Note (Signed)
 [facility] Patient Name: Calvin Salazar Procedure Date: 06/30/2023 10:28 AM MRN: [id number] Endoscopist: Iva Boop , MD, [id number] Age: 73 Referring MD:  Date of Birth: 02/27/1951 Gender: Male Account #: [id number] Procedure:                Colonoscopy Indications:              Surveillance: Personal history of adenomatous                            polyps on last colonoscopy > 5 years ago, Last                            colonoscopy: 2016 Also FHx CRCA mother and father Medicines:                Monitored Anesthesia Care Procedure:                Pre-Anesthesia Assessment:                           - Prior to the procedure, a History and Physical                            was performed, and patient medications and                            allergies were reviewed. The patient's tolerance of                            previous anesthesia was also reviewed. The risks                            and benefits of the procedure and the sedation                            options and risks were discussed with the patient.                            All questions were answered, and informed consent                            was obtained. Prior Anticoagulants: The patient has                            taken no anticoagulant or antiplatelet agents. ASA                            Grade Assessment: III - A patient with severe                            systemic disease. After reviewing the risks and                            benefits, the patient was deemed in satisfactory  condition to undergo the procedure.                           After obtaining informed consent, the colonoscope                            was passed under direct vision. Throughout the                            procedure, the patient's blood pressure, pulse, and                            oxygen saturations were monitored continuously. The                            Olympus Scope  SN [id number] was introduced through the                            anus and advanced to the the cecum, identified by                            appendiceal orifice and ileocecal valve. The                            colonoscopy was performed without difficulty. The                            patient tolerated the procedure well. The quality                            of the bowel preparation was good. The ileocecal                            valve, appendiceal orifice, and rectum were                            photographed. The bowel preparation used was SUPREP                            via split dose instruction. Scope In: 10:34:32 AM Scope Out: 10:45:14 AM Scope Withdrawal Time: 0 hours 8 minutes 16 seconds  Total Procedure Duration: 0 hours 10 minutes 42 seconds  Findings:                 The perianal and digital rectal examinations were                            normal.                           Multiple diverticula were found in the sigmoid                            colon.  The exam was otherwise without abnormality on                            direct and retroflexion views. Complications:            No immediate complications. Estimated Blood Loss:     Estimated blood loss: none. Impression:               - Diverticulosis in the sigmoid colon.                           - The examination was otherwise normal on direct                            and retroflexion views.                           - No specimens collected.                           - Personal history of colonic polyps. 2 diminutive                            adenomas 2016 + family hx CRCA motehr (60's) and                            father (70's) Recommendation:           - Patient has a contact number available for                            emergencies. The signs and symptoms of potential                            delayed complications were discussed with the                             patient. Return to normal activities tomorrow.                            Written discharge instructions were provided to the                            patient.                           - Resume previous diet.                           - Continue present medications.                           - No recommendation at this time regarding repeat                            colonoscopy due to age. He can decuide to return in  5 years if desired - will be 66 so most likely low                            utility of repeating but both parents did have CRCA Iva Boop, MD 06/30/2023 10:59:41 AM This report has been signed electronically.

## 2023-06-30 NOTE — Patient Instructions (Addendum)
 Handout provided about diverticulosis.    Resume previous diet.  Continue present medications.  No recommendation at this time regarding repeat colonoscopy due to age.  He can decide to return in 5 years if desired- will be 84 so most likely low utility of repeating but both parents did have CRCA.  YOU HAD AN ENDOSCOPIC PROCEDURE TODAY AT THE [facility]:   Refer to the procedure report that was given to you for any specific questions about what was found during the examination.  If the procedure report does not answer your questions, please call your gastroenterologist to clarify.  If you requested that your care partner not be given the details of your procedure findings, then the procedure report has been included in a sealed envelope for you to review at your convenience later.  YOU SHOULD EXPECT: Some feelings of bloating in the abdomen. Passage of more gas than usual.  Walking can help get rid of the air that was put into your GI tract during the procedure and reduce the bloating. If you had a lower endoscopy (such as a colonoscopy or flexible sigmoidoscopy) you may notice spotting of blood in your stool or on the toilet paper. If you underwent a bowel prep for your procedure, you may not have a normal bowel movement for a few days.  Please Note:  You might notice some irritation and congestion in your nose or some drainage.  This is from the oxygen used during your procedure.  There is no need for concern and it should clear up in a day or so.  SYMPTOMS TO REPORT IMMEDIATELY:  Following lower endoscopy (colonoscopy or flexible sigmoidoscopy):  Excessive amounts of blood in the stool  Significant tenderness or worsening of abdominal pains  Swelling of the abdomen that is new, acute  Fever of 100F or higher    For urgent or emergent issues, a gastroenterologist can be reached at any hour by calling [phone]. Do not use MyChart messaging for urgent concerns.     DIET:  We do recommend a small meal at first, but then you may proceed to your regular diet.  Drink plenty of fluids but you should avoid alcoholic beverages for 24 hours.  ACTIVITY:  You should plan to take it easy for the rest of today and you should NOT DRIVE or use heavy machinery until tomorrow (because of the sedation medicines used during the test).    FOLLOW UP: Our staff will call the number listed on your records the next business day following your procedure.  We will call around 7:15- 8:00 am to check on you and address any questions or concerns that you may have regarding the information given to you following your procedure. If we do not reach you, we will leave a message.     If any biopsies were taken you will be contacted by phone or by letter within the next 1-3 weeks.  Please call us at [phone] if you have not heard about the biopsies in 3 weeks.    SIGNATURES/CONFIDENTIALITY: You and/or your care partner have signed paperwork which will be entered into your electronic medical record.  These signatures attest to the fact that that the information above on your After Visit Summary has been reviewed and is understood.  Full responsibility of the confidentiality of this discharge information lies with you and/or your care-partner.No polyps or cancer were seen.  You do have diverticulosis - thickened muscle rings and pouches in the colon  wall. Please read the handout about this condition.  I appreciate the opportunity to care for you. Iva Boop, MD, [facility]

## 2023-06-30 NOTE — Progress Notes (Signed)
 Pt sedate, gd SR's, VSS, report to RN

## 2023-07-01 ENCOUNTER — Telehealth: Payer: Self-pay

## 2023-07-01 NOTE — Telephone Encounter (Signed)
  Follow up Call-     06/30/2023    9:35 AM  Call back number  Post procedure Call Back phone  # [phone]  Permission to leave phone message Yes     Patient questions:  Do you have a fever, pain , or abdominal swelling? No. Pain Score  0 *  Have you tolerated food without any problems? Yes.    Have you been able to return to your normal activities? Yes.    Do you have any questions about your discharge instructions: Diet   No. Medications  No. Follow up visit  No.  Do you have questions or concerns about your Care? No.  Actions: * If pain score is 4 or above: No action needed, pain <4.

## 2023-12-15 ENCOUNTER — Encounter: Payer: Self-pay | Admitting: Physician Assistant

## 2024-03-29 NOTE — Progress Notes (Addendum)
 Assessment & Plan    Memory Impairment , concern for Alzheimer's disease   Calvin Salazar is a pleasant 73 y.o. year old RH male with a history of hypertension, hyperlipidemia, OSA, arthritis, DM, prostate cancer, initially seen at [facility] in 2021, and treated with donepezil and memantine .  He was last seen at their office in 2023, then lost to follow-up.  No formal diagnosis of dementia due to Alzheimer's disease.  He is seen today for evaluation of memory loss. MoCA today is  19/30.  The patient is accompanied by his girlfriend who supplements  the history.     - Ordered MRI of the brain  - Ordered neurocognitive testing for further evaluation. - Ordered labs including vitamin B12 and thyroid function tests. - Continue donepezil and memantine . - Recommended follow-up with pulmonology for sleep apnea management. - Encouraged participation in social and physical activities. - Advised on maintaining a healthy diet and exercise regimen.   Discussed the use of AI scribe software for clinical note transcription with the patient, who gave verbal consent to proceed.  History of Present Illness Calvin Salazar is a 73 year old male who presents with memory concerns. He is accompanied by Ronal, his long-term partner and primary caregiver. He was referred by Dr. Loreli for evaluation of memory concerns.  He has been experiencing memory difficulties for approximately five to six years. Initially seen at [facility] in 2021, he was treated with donepezil and memantine . His last visit there was in 2023, with no follow-up appointment made. He describes his memory as 'about the same as it has been forever,' but Ronal notes worsening short-term memory issues over the past two years, including difficulty remembering recent conversations, new information, and names. Long-term memory appears intact as he recognizes his children. He frequently repeats questions and has become less conversant in social  settings. He and Ronal are socially active, often participating in bridge games and traveling on [place], although he is less engaged socially than before.   Ronal reports that he has difficulty with tasks he previously managed well, such as making bridge boards, which he now struggles to complete efficiently. An incident at a grocery store occurred where he forgot what he was supposed to buy and was unsure of how he got there. He has not been driving recently, as Ronal is concerned about his safety, although he can drive with clear directions.  He does not exhibit disorientation at home, does not wander, and has no history of hallucinations or seizures. Ronal notes that he has moments of irritability and mood changes, which are unlike his usual nature. He sleeps a lot but does not experience nightmares or dream reenactment. He does not use a CPAP machine for his sleep apnea, which he finds uncomfortable.  He has lost approximately 30 pounds over the past three to four years, attributed to Ozempic use. He has no history of falls or head injuries, although he played football in the past. He is scheduled for cataract surgery next week.  He has no issues with incontinence or bowel problems.  He does not consume alcohol or tobacco and has no known family history of dementia.        No Known Allergies  Current Outpatient Medications  Medication Instructions   aspirin EC 81 mg, Daily   cyanocobalamin (VITAMIN B12) 1,000 mcg, Daily   donepezil (ARICEPT) 10 mg, Daily at bedtime   memantine  (NAMENDA ) 10 mg, Oral, 2 times daily  metFORMIN (GLUCOPHAGE) 1,000 mg, 2 times daily with meals   rosuvastatin (CRESTOR) 10 mg, Daily   Semaglutide, 1 MG/DOSE, (OZEMPIC, 1 MG/DOSE,) 4 MG/3ML SOPN INJECT 1MG  SUBCUTANEOUS ONCE A WEEK     VITALS:   Vitals:   03/30/24 0946  BP: 134/78  Pulse: 63  Resp: 20  SpO2: 95%  Weight: 225 lb (102.1 kg)  Height: 5' 10 (1.778 m)     Neurological Exam     03/30/2024     7:00 PM  Montreal Cognitive Assessment   Visuospatial/ Executive (0/5) 4  Naming (0/3) 2  Attention: Read list of digits (0/2) 2  Attention: Read list of letters (0/1) 1  Attention: Serial 7 subtraction starting at 100 (0/3) 3  Language: Repeat phrase (0/2) 1  Language : Fluency (0/1) 1  Abstraction (0/2) 2  Delayed Recall (0/5) 0  Orientation (0/6) 3  Total 19  Adjusted Score (based on education) 19       08/14/2021    9:49 AM 04/02/2020    2:03 PM  MMSE - Mini Mental State Exam  Orientation to time 3 5  Orientation to Place 5 4  Registration 3 3  Attention/ Calculation 1 1  Recall 1 2  Language- name 2 objects 2 2  Language- repeat 1 1  Language- follow 3 step command 1 3  Language- read & follow direction 1 1  Write a sentence 1 1  Copy design 1 1  Total score 20 24       Orientation:  Alert and oriented to person, place and not to time. No aphasia or dysarthria. Fund of knowledge is appropriate. Recent and remote memory impaired.  Attention and concentration are reduced.  Able to name objects and repeat phrases 1/2.  Delayed recall  0/5 .  Cranial nerves: There is good facial symmetry. Extraocular muscles are intact and visual fields are full to confrontational testing. Speech is fluent and clear however, he takes time to answer questions. No tongue deviation. Hearing is intact to conversational tone.  Tone: Tone is good throughout. Sensation: Sensation is intact to light touch.  Vibration is intact at the bilateral big toe.  Coordination: The patient has no difficulty with RAM's or FNF bilaterally. Normal finger to nose  Motor: Strength is 5/5 in the bilateral upper and lower extremities. There is no pronator drift. There are no fasciculations noted. DTR's: Deep tendon reflexes are 2/4 bilaterally. Gait and Station: The patient is able to ambulate without difficulty. Gait is cautious and narrow. Stride length is normal.        Thank you for allowing us  the  opportunity to participate in the care of this nice patient. Please do not hesitate to contact us  for any questions or concerns.   Total time spent on today's visit was 60 minutes dedicated to this patient today, preparing to see patient, examining the patient, ordering tests and/or medications and counseling the patient, documenting clinical information in the EHR or other health record, independently interpreting results and communicating results to the patient/family, discussing treatment and goals, answering patient's questions and coordinating care.  Cc:  Loreli Elsie JONETTA Mickey., MD  Camie Sevin 03/30/2024 7:21 PM

## 2024-03-30 ENCOUNTER — Other Ambulatory Visit

## 2024-03-30 ENCOUNTER — Ambulatory Visit: Admitting: Physician Assistant

## 2024-03-30 ENCOUNTER — Encounter: Payer: Self-pay | Admitting: Physician Assistant

## 2024-03-30 ENCOUNTER — Ambulatory Visit

## 2024-03-30 VITALS — BP 134/78 | HR 63 | Resp 20 | Ht 70.0 in | Wt 225.0 lb

## 2024-03-30 DIAGNOSIS — R413 Other amnesia: Secondary | ICD-10-CM

## 2024-03-30 LAB — TSH: TSH: 1.49 m[IU]/L (ref 0.40–4.50)

## 2024-03-30 LAB — VITAMIN B12: Vitamin B-12: 458 pg/mL (ref 200–1100)

## 2024-03-30 NOTE — Patient Instructions (Addendum)
 It was a pleasure to see you today at our office.   Recommendations:  Follow up in 3 months  Continue donepezil 10 mg daily and memantine  10 mg twice daily.  Side effects discussed  MRI brain at [facility] [phone] Labs today [address] Neurocognitive testing  Recommend visiting the website :  Dementia Success Path to better understand some behaviors related to memory loss.        [email or web address]   RECOMMENDATIONS FOR ALL PATIENTS WITH MEMORY PROBLEMS: 1. Continue to exercise (Recommend 30 minutes of walking everyday, or 3 hours every week) 2. Increase social interactions - continue going to [place] and enjoy social gatherings with friends and family 3. Eat healthy, avoid fried foods and eat more fruits and vegetables 4. Maintain adequate blood pressure, blood sugar, and blood cholesterol level. Reducing the risk of stroke and cardiovascular disease also helps promoting better memory. 5. Avoid stressful situations. Live a simple life and avoid aggravations. Organize your time and prepare for the next day in anticipation. 6. Sleep well, avoid any interruptions of sleep and avoid any distractions in the bedroom that may interfere with adequate sleep quality 7. Avoid sugar, avoid sweets as there is a strong link between excessive sugar intake, diabetes, and cognitive impairment We discussed the Mediterranean diet, which has been shown to help patients reduce the risk of progressive memory disorders and reduces cardiovascular risk. This includes eating fish, eat fruits and green leafy vegetables, nuts like almonds and hazelnuts, walnuts, and also use olive oil. Avoid fast foods and fried foods as much as possible. Avoid sweets and sugar as sugar use has been linked to worsening of memory function.  There is always a concern of gradual progression of memory problems. If this is the case, then we may need to adjust level of  care according to patient needs. Support, both to the patient and caregiver, should then be put into place.    The [employer] Association is here all day, every day for people facing Alzheimer's disease through our free 24/7 Helpline: [phone]. The Helpline provides reliable information and support to all those who need assistance, such as individuals living with memory loss, Alzheimer's or other dementia, caregivers, health care professionals and the public.  Our highly trained and knowledgeable staff can help you with: Understanding memory loss, dementia and Alzheimer's  Medications and other treatment options  General information about aging and brain health  Skills to provide quality care and to find the best care from professionals  Legal, financial and living-arrangement decisions Our Helpline also features: Confidential care consultation provided by master's level clinicians who can help with decision-making support, crisis assistance and education on issues families face every day  Help in a caller's preferred language using our translation service that features more than 200 languages and dialects  Referrals to local community programs, services and ongoing support     FALL PRECAUTIONS: Be cautious when walking. Scan the area for obstacles that may increase the risk of trips and falls. When getting up in the mornings, sit up at the edge of the bed for a few minutes before getting out of bed. Consider elevating the bed at the head end to avoid drop of blood pressure when getting up. Walk always in a well-lit room (use night lights in the walls). Avoid area rugs or power cords from appliances in the middle of the walkways. Use a walker or a cane if necessary and consider physical therapy for balance exercise. Get your eyesight checked  regularly.  FINANCIAL OVERSIGHT: Supervision, especially oversight when making financial decisions or transactions is also recommended.  HOME SAFETY:  Consider the safety of the kitchen when operating appliances like stoves, microwave oven, and blender. Consider having supervision and share cooking responsibilities until no longer able to participate in those. Accidents with firearms and other hazards in the house should be identified and addressed as well.   ABILITY TO BE LEFT ALONE: If patient is unable to contact 911 operator, consider using LifeLine, or when the need is there, arrange for someone to stay with patients. Smoking is a fire hazard, consider supervision or cessation. Risk of wandering should be assessed by caregiver and if detected at any point, supervision and safe proof recommendations should be instituted.  MEDICATION SUPERVISION: Inability to self-administer medication needs to be constantly addressed. Implement a mechanism to ensure safe administration of the medications.   DRIVING: Regarding driving, in patients with progressive memory problems, driving will be impaired. We advise to have someone else do the driving if trouble finding directions or if minor accidents are reported. Independent driving assessment is available to determine safety of driving.   If you are interested in the driving assessment, you can contact the following:  [employer] in [place] [phone]   [facility] [phone] or [phone]      Mediterranean Diet A Mediterranean diet refers to food and lifestyle choices that are based on the traditions of countries located on the [employer]. This way of eating has been shown to help prevent certain conditions and improve outcomes for people who have chronic diseases, like kidney disease and heart disease. What are tips for following this plan? Lifestyle  Cook and eat meals together with your family, when possible. Drink enough fluid to keep your urine clear or pale yellow. Be physically active every day. This  includes: Aerobic exercise like running or swimming. Leisure activities like gardening, walking, or housework. Get 7-8 hours of sleep each night. If recommended by your health care provider, drink red wine in moderation. This means 1 glass a day for nonpregnant women and 2 glasses a day for men. A glass of wine equals 5 oz (150 mL). Reading food labels  Check the serving size of packaged foods. For foods such as rice and pasta, the serving size refers to the amount of cooked product, not dry. Check the total fat in packaged foods. Avoid foods that have saturated fat or trans fats. Check the ingredients list for added sugars, such as corn syrup. Shopping  At the grocery store, buy most of your food from the areas near the walls of the store. This includes: Fresh fruits and vegetables (produce). Grains, beans, nuts, and seeds. Some of these may be available in unpackaged forms or large amounts (in bulk). Fresh seafood. Poultry and eggs. Low-fat dairy products. Buy whole ingredients instead of prepackaged foods. Buy fresh fruits and vegetables in-season from local farmers markets. Buy frozen fruits and vegetables in resealable bags. If you do not have access to quality fresh seafood, buy precooked frozen shrimp or canned fish, such as tuna, salmon, or sardines. Buy small amounts of raw or cooked vegetables, salads, or olives from the deli or salad bar at your store. Stock your pantry so you always have certain foods on hand, such as olive oil, canned tuna, canned tomatoes, rice, pasta, and beans. Cooking  Cook foods with extra-virgin olive oil instead of using butter or other vegetable oils. Have meat  as a side dish, and have vegetables or grains as your main dish. This means having meat in small portions or adding small amounts of meat to foods like pasta or stew. Use beans or vegetables instead of meat in common dishes like chili or lasagna. Experiment with different cooking methods. Try  roasting or broiling vegetables instead of steaming or sauteing them. Add frozen vegetables to soups, stews, pasta, or rice. Add nuts or seeds for added healthy fat at each meal. You can add these to yogurt, salads, or vegetable dishes. Marinate fish or vegetables using olive oil, lemon juice, garlic, and fresh herbs. Meal planning  Plan to eat 1 vegetarian meal one day each week. Try to work up to 2 vegetarian meals, if possible. Eat seafood 2 or more times a week. Have healthy snacks readily available, such as: Vegetable sticks with hummus. Greek yogurt. Fruit and nut trail mix. Eat balanced meals throughout the week. This includes: Fruit: 2-3 servings a day Vegetables: 4-5 servings a day Low-fat dairy: 2 servings a day Fish, poultry, or lean meat: 1 serving a day Beans and legumes: 2 or more servings a week Nuts and seeds: 1-2 servings a day Whole grains: 6-8 servings a day Extra-virgin olive oil: 3-4 servings a day Limit red meat and sweets to only a few servings a month What are my food choices? Mediterranean diet Recommended Grains: Whole-grain pasta. Brown rice. Bulgar wheat. Polenta. Couscous. Whole-wheat bread. [place] Made[place]. Vegetables: Artichokes. Beets. Broccoli. Cabbage. Carrots. Eggplant. Green beans. Chard. Kale. Spinach. Onions. Leeks. Peas. Squash. Tomatoes. Peppers. Radishes. Fruits: Apples. Apricots. Avocado. Berries. Bananas. Cherries. Dates. Figs. Grapes. Lemons. Melon. Oranges. Peaches. Plums. Pomegranate. Meats and other protein foods: Beans. Almonds. Sunflower seeds. Pine nuts. Peanuts. Cod. Salmon. Scallops. Shrimp. Tuna. Tilapia. Clams. Oysters. Eggs. Dairy: Low-fat milk. Cheese. Greek yogurt. Beverages: Water . Red wine. Herbal tea. Fats and oils: Extra virgin olive oil. Avocado oil. Grape seed oil. Sweets and desserts: Greek yogurt with honey. Baked apples. Poached pears. Trail mix. Seasoning and other foods: Basil. Cilantro. Coriander. Cumin. Mint.  Parsley. Sage. Rosemary. Tarragon. Garlic. Oregano. Thyme. Pepper. Balsalmic vinegar. Tahini. Hummus. Tomato sauce. Olives. Mushrooms. Limit these Grains: Prepackaged pasta or rice dishes. Prepackaged cereal with added sugar. Vegetables: Deep fried potatoes (french fries). Fruits: Fruit canned in syrup. Meats and other protein foods: Beef. Pork. Lamb. Poultry with skin. Hot dogs. [place]. Dairy: Ice cream. Sour cream. Whole milk. Beverages: Juice. Sugar-sweetened soft drinks. Beer. Liquor and spirits. Fats and oils: Butter. Canola oil. Vegetable oil. Beef fat (tallow). Lard. Sweets and desserts: Cookies. Cakes. Pies. Candy. Seasoning and other foods: Mayonnaise. Premade sauces and marinades. The items listed may not be a complete list. Talk with your dietitian about what dietary choices are right for you. Summary The Mediterranean diet includes both food and lifestyle choices. Eat a variety of fresh fruits and vegetables, beans, nuts, seeds, and whole grains. Limit the amount of red meat and sweets that you eat. Talk with your health care provider about whether it is safe for you to drink red wine in moderation. This means 1 glass a day for nonpregnant women and 2 glasses a day for men. A glass of wine equals 5 oz (150 mL). This information is not intended to replace advice given to you by your health care provider. Make sure you discuss any questions you have with your health care provider. Document Released: 12/05/2015 Document Revised: 01/07/2016 Document Reviewed: 12/05/2015 Elsevier Interactive Patient Education  2017 [employer].

## 2024-03-31 ENCOUNTER — Ambulatory Visit: Payer: Self-pay | Admitting: Physician Assistant

## 2024-04-13 ENCOUNTER — Inpatient Hospital Stay: Admission: RE | Admit: 2024-04-13

## 2024-04-28 ENCOUNTER — Ambulatory Visit: Payer: Self-pay | Admitting: Psychology

## 2024-04-28 ENCOUNTER — Ambulatory Visit: Admitting: Psychology

## 2024-04-28 DIAGNOSIS — R4189 Other symptoms and signs involving cognitive functions and awareness: Secondary | ICD-10-CM

## 2024-04-28 DIAGNOSIS — G3184 Mild cognitive impairment, so stated: Secondary | ICD-10-CM | POA: Diagnosis not present

## 2024-04-28 DIAGNOSIS — F04 Amnestic disorder due to known physiological condition: Secondary | ICD-10-CM | POA: Diagnosis not present

## 2024-04-28 NOTE — Progress Notes (Signed)
 "  NEUROPSYCHOLOGICAL EVALUATION [place]. [facility]  [place] Department of Neurology  Date of Evaluation: 04/28/2024  REASON FOR REFERRAL   Calvin Salazar is a 74 year old, right-handed, White male with 16 years of formal education. He was referred for neuropsychological evaluation by Camie Sevin, PA-C, to assess current neurocognitive functioning, document potential cognitive deficits, and assist with treatment planning. This is his first neuropsychological evaluation.  SUMMARY OF RESULTS   Premorbid cognitive abilities are estimated to be in the average range based on word reading and sociodemographic factors. Relative to this baseline estimate, current performance was below expectations on learning/memory tasks and variable on tasks in other domains.   Specifically, he demonstrated consistently poor immediate and delayed recall across all memory tasks, including a word list, short stories, and shapes, although recognition was relatively preserved. Working memory was generally intact, with the exception of a digit sequencing task. Executive functioning was largely intact, though he committed to high number of errors on a response inhibition task when a switching component was introduced. Language abilities were also mostly intact, except for confrontation naming. All other measures of processing speed and visuospatial abilities were within expectations.  On self-report questionnaires, he did not endorse significant symptoms of depression or anxiety.  DIAGNOSTIC IMPRESSION   Results of the current evaluation indicated primary deficits in memory and confrontation naming, with performance in other cognitive domains largely preserved. There is somewhat of a discrepancy between his functional needs and his cognitive test scores, which complicates diagnosis. His presentation is borderline, and an argument could be made for either mild cognitive impairment or very mild dementia. Serial  monitoring is recommended to observe the trajectory of cognitive changes over time. The overall pattern of deficits certainly raises concern for a possible early neurodegenerative process. Additional neurological workup (e.g., blood-based biomarkers, CSF analysis, amyloid imaging) may help clarify the underlying etiology. He has untreated sleep apnea, which could contribute to cognitive difficulties, though it is unlikely to fully explain some of the weaknesses observed today. Prioritizing treatment may help isolate other contributing factors when reassessing in approximately one year. He also has a few vascular risk factors; however, neuroimaging demonstrates only mild small vessel ischemic changes, which is reassuring.   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  Continue to have your partner or a trusted family member assist with management of medications, finances, and medical appointments to ensure that errors do not occur.  Discuss alternatives to your CPAP machine with your sleep doctor, as untreated sleep apnea can contribute to daytime fatigue, cognitive problems, and low mood.  Patient has already been prescribed a medication (i.e., donepezil, memantine ) aimed at addressing memory concerns. He is encouraged to continue taking this medication as prescribed. It is important to highlight that this medication has been shown to slow functional decline in some individuals.  Prioritize physical health through diet, exercise, and sleep. Regular physical activity supports cardiovascular health, improves mood, and helps preserve mobility and independence. Aim for at least 150 minutes of moderate aerobic exercise per week (e.g., brisk walking, swimming, gardening). A brain-healthy diet such as the Mediterranean or MIND diet is rich in fruits, vegetables, whole grains, healthy fats, and lean proteins, and has been  associated with reduced risk of cognitive decline. Additionally, getting adequate, quality sleep and managing chronic conditions with the help of healthcare providers are essential components of healthy aging.  Continue to stay socially and mentally engaged. Maintaining strong social  connections and regularly stimulating your brain can help protect against cognitive decline. This includes staying connected with friends and family, volunteering, or participating in community groups. Mentally engaging activities--such as reading, doing puzzles, playing strategy games, or learning a new language or musical instrument--promote brain plasticity. If you are interested in activities to support cognitive engagement, this site offers a variety of apps and games organized by difficulty level:  https://www.barrow[facility].org/get-to-know-barrow/centers-programs/neurorehabilitation-center/neuro-rehab-apps-and-games/  Consider implementing compensatory strategies to maximize independence and maintain daily functioning. Examples include:  Adhere to routine. Compensatory strategies work best when they are used consistently. Use a planner, calendar, or white board that has the schedule and important events for the day clearly listed to reference and cross off when tasks are complete.  Ask for written information, especially if it is new or unfamiliar (e.g., information provided at a doctor's appointment).  Create an organized environment. Keep items that can be easily misplaced in a sensible location and get into the habit of always returning the items to those places. Pay attention and reduce distractions. Make a point of focusing attention on information you want to remember. One-on-one interaction is more likely to facilitate attention and minimize distraction. Make eye contact and repeat the information out loud after you hear it. Reduce interruptions or distractions especially when attempting to learn new information.   Create associations. When learning something new, think about and understand the information. Explain it in your own words or try to associate it with something you already know. Take notes to help remember important details. Evaluate goals and plan accordingly. When confronted by many different tasks, begin by making a list that prioritizes each task and estimates the time it will take to complete. Break down complicated tasks into smaller, more manageable steps. Focus on one task at a time and complete each task before starting another. Avoid multitasking.  If patient requires legal assistance with durable powers of attorney, medical decision making, long-term care resource access, or other aspects of estate planning, they may consider contacting [employer] at [phone] for a free consultation.  DISPOSITION   Patient will follow up with the referring provider, Ms. Wertman. He should return for repeat neuropsychological testing in 12-18 months to monitor his course and assist with diagnosis and treatment planning. He and his partner will be provided verbal feedback in approximately one week regarding the findings and impression during this visit.  The remainder of the report includes the details of the patient's background and a table of results from the current evaluation, which support the summary and recommendations described above.  BACKGROUND   History of Presenting Illness: The following information was obtained from a review of medical records and an interview with the patient and his partner, Ronal. Briefly, the patient initially established care at [facility] in 2021 for the management of sleep apnea. Later that same year, in December, his partner began raising concerns regarding memory loss. Patient himself denied any concerns; however, his partner reported that she and his children were worried due to his forgetfulness and tendency to repeat himself. More  recently, he was evaluated by Camie Sevin, PA-C, at [facility] on 03/30/2024 for worsening memory concerns observed by his partner, while the patient reported stability. MoCA = 19/30. 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  Cognitive Functioning: During todays appointment,  the patient and his partner reported cognitive concerns present for at least the past five years. They primarily described changes in short-term memory and navigation. They denied significant difficulties with attention, processing speed, language, depth perception, or executive functioning. Patient acknowledged being very forgetful but stated that this does not significantly bother him. He reported uncertainty about the purpose of todays testing, noting that he is aware of his difficulties and feels accepting of them. His partner reported that his short-term memory is markedly impaired and that he often repeats himself multiple times within a short period. For example, he asked for the date three times on the morning of this appointment. He may forget details of conversations or the purpose of entering a store. She described a recent incident in which he went to the grocery store to purchase three items, took an extended amount of time, and was later found wandering the store stating he could not find one item, with no recollection of needing to purchase the other two. He also did not remember who had driven them to the store. She noted that he now spends much of the day watching television, though she is unsure how much he comprehends the content. He was previously an avid reader; although he continues to purchase books, he typically opens them to the middle, reads a few pages, and does not return to them. Finally, she reported that he was  once an accomplished bridge player but is no longer able to play.  Physical Functioning: Patient denied difficulties with sleep initiation and maintenance. However, he is not currently treating his sleep apnea; he has obtained a CPAP mask but has been unable to tolerate wearing it. Appetite is stable, with no reported changes in smell or taste. Vision and hearing are stable. He underwent cataract surgery on one eye last week and plans to have the other eye treated this upcoming Tuesday, with no reported complications or concerns. He denied balance problems, falls, or tremors.  Emotional Functioning: Patient described his recent mood as pretty good and denied any suicidal ideation. His partner corroborated this report, noting that he is generally happy and kind toward others.  Neuroimaging: MRI of the brain (04/13/2024) documented mild cerebral atrophy without clear lobar predominance, likely within normal limits for age, and mild chronic small vessel ischemic disease.  Other Relevant Medical History: Remarkable for hyperlipidemia, diabetes, sleep apnea, and history of prostate cancer. Please refer to the medical record for a more comprehensive problem list. No history of stroke, CNS infection, head injury, or seizure was reported.  Current Medications: Per record, aspirin, donepezil, memantine , metformin, rosuvastatin, semaglutide, and vitamin B12.   Functional Status: Patient stopped driving one month ago at the request of his partner and healthcare providers due to memory concerns. His partner manages medications and finances, noting that he would likely be unable to manage these independently without her assistance. He denied any significant difficulties operating household appliances. He remains independent with all basic activities of daily living.  Family Neurological History: Unremarkable.  Psychiatric History: History of depression, anxiety, prior mental health treatment, suicidal ideation,  hallucinations, and psychiatric hospitalizations was not reported.  Substance Use History: Patient reported rare alcohol consumption and denied current use of nicotine, marijuana, and other illicit substances was denied. Additionally, there is no reported history of past problematic substance use.  Social and Developmental History: Patient was born in [place], [place]. History of perinatal complications and developmental delays was not reported. Patient currently lives with his partner of 54  years. He has three children.  Educational and Occupational History: No history of childhood learning disability, special education services, or grade retention was reported. 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 He earned a bachelor's degree in philosophy. Prior to retirement, he was employed as an naval architect.   BEHAVIORAL OBSERVATIONS   Patient arrived on time and was accompanied by his partner, Ronal. He ambulated independently and without gait disturbance. He was alert and fully oriented. He was appropriately groomed and dressed for the setting. No significant sensory or motor abnormalities were observed. Vision and hearing were adequate for testing purposes. Speech was of normal rate, prosody, and volume. No conversational word-finding difficulties, paraphasic errors, or dysarthria were observed. Comprehension was conversationally intact. Thought processes were linear, logical, and coherent. Thought content was organized and devoid of delusions. Insight appeared fair. Affect was even and congruent with euthymic mood. He was cooperative and appeared to give adequate effort during testing, including on embedded measures of performance validity. Results are thought to accurately reflect his cognitive functioning at this time.  NEUROPSYCHOLOGICAL TESTING RESULTS   Tests Administered: Animal Naming Test; Brief Visuospatial Memory Test-Revised (BVMT-R) - Form 1; [place]  Verbal  Learning Test Third Edition (CVLT3) - Brief Form; Controlled Oral Word Association Test (COWAT): FAS; Delis-Kaplan Executive Function System (D-KEFS) - Subtest(s): Color-Word Interference Test; Geriatric Anxiety Scale-10 Item (GAS-10); Geriatric Depression Scale Short Form (GDS-SF); Neuropsychological Assessment Battery (NAB) - Subtest(s): Naming Form 1; Repeatable Battery for the Assessment of Neuropsychological Status Update (RBANS Update) Form A - Subtest(s): Line Orientation; Test of Premorbid Functioning (TOPF); Trail Making Test (TMT); Wechsler Adult Intelligence Scale Fifth Edition (WAIS-5) - Subtest(s): Similarities, Clinical Cytogeneticist, Matrix Reasoning, Digits Forward, Digit Sequencing, Coding, Symbol Search, Digits Backward; and Wechsler Memory Scale Fourth Edition (WMS-IV) - Subtest(s): Logical Memory (LM).  Test results are provided in the table below. Whenever possible, the patient's scores were compared against age-, sex-, and education-corrected normative samples. Interpretive descriptions are based on the AACN consensus conference statement on uniform labeling (Guilmette et al., 2020).  PREMORBID FUNCTIONING RAW  RANGE  TOPF 50 StdS=108 Average  ATTENTION & WORKING MEMORY RAW  RANGE  WAIS-5 Digits Forward -- ss=12 High Average  WAIS-5 Digits Backward -- ss=10 Average  WAIS-5 Digit Sequencing -- ss=4 Below Average  PROCESSING SPEED RAW  RANGE  Trails A 40''0e T=44 Average  WAIS-5 Coding  -- ss=8 Average  WAIS-5 Symbol Search -- ss=7 Low Average  DKEFS CWIT Color Naming 33''0e ss=10 Average  DKEFS CWIT Word Reading 23''0e ss=11 Average  EXECUTIVE FUNCTION RAW  RANGE  Trails B 118''0e T=44 Average  WAIS-5 Matrix Reasoning -- ss=11 Average  WAIS-5 Similarities -- ss=9 Average  COWAT Letter Fluency 15+15+16 T=56 Average  DKEFS CWIT Inhibition 97''1e ss=6 Low Average  DKEFS CWIT Inhibition/Switching 60''20e ss=13 High Average  LANGUAGE RAW  RANGE  COWAT Letter Fluency 15+15+16 T=56  Average  Animal Naming Test 16 T=44 Average  NAB Naming Test 24/31 +2 w/PC T=23 BNL  VISUOSPATIAL RAW  RANGE  RBANS Line Orientation -- 51-75%ile Average  WAIS-5 Block Design -- ss=10 Average  BVMT-R Copy Trial 12/12 -- WNL  VERBAL LEARNING & MEMORY RAW  RANGE  CVLT3 Total 1-4 (4+4+4+6)/36 StdS=73 Below Average  CVLT3 SDFR  2/9 ss=1 Exceptionally Low  CVLT3 LDFR  0/9 ss=1 Exceptionally Low  CVLT3 LDCR  1/9 ss=1 Exceptionally Low  CVLT3 Recognition Hits 7 ss=7 Low Average  CVLT3 Recognition False+ 2 ss=7 Low Average  CVLT3 Discriminability -- ss=6  Low Average  CVLT3 Intrusions 1 ss=10 Average  CVLT3 Repetitions 0 ss=14 High Average  CVLT3 Forced Choice 9/9 -- WNL  WMS-IV LM-I  (4+5+0)/53 ss=3 Exceptionally Low  WMS-IV LM-II  (2+0)/39 ss=4 Below Average  WMS-IV LM Recognition  (6+9)/23 17-25%ile Low Average  VISUAL LEARNING & MEMORY RAW  RANGE  BVMT-R Total Recall (1+3+3)/36 T=27 Exceptionally Low  BVMT-R Delayed Recall 0/12 T=20 Exceptionally Low  BVMT-R Recognition Hits 5 >16%ile WNL  BVMT-R Recognition False Alarms 0 >16%ile WNL  BVMT-R Recognition Discrimination Index 5 >16%ile WNL  QUESTIONNAIRES RAW  RANGE  GDS-SF 3 -- Minimal  GAS-10 0 -- Minimal  *Note: ss = scaled score; StdS = standard score; T = t-score; C/S = corrected raw score; WNL = within normal limits; BNL= below normal limits; D/C = discontinued. Scores from skewed distributions are typically interpreted as WNL (>=16th %ile) or BNL (<16th %ile).   INFORMED CONSENT   Patient was provided with a verbal description of the nature and purpose of the neuropsychological evaluation. Also reviewed were the foreseeable risks and/or discomforts and benefits of the procedure, limits of confidentiality, and mandatory reporting requirements of this provider. Patient was given the opportunity to have their questions answered. Oral consent to participate was provided by the patient.   This report was prepared as part of a clinical  evaluation and is not intended for forensic use.  SERVICE   This evaluation was conducted by Renda Beckwith, Psy.D. In addition to time spent directly with the patient, total professional time (120 minutes) includes record review, integration of relevant medical history, test selection, interpretation of findings, and report preparation. A technician, Lonell Jude, B.S., provided testing and scoring assistance (130 minutes).  Psychiatric Diagnostic Evaluation Services (Professional): 09208 x 1 Neuropsychological Testing Evaluation Services (Professional): 03867 x 1 Neuropsychological Testing Evaluation Services (Professional): 03866 x 1 Neuropsychological Test Administration and Scoring Radiographer, Therapeutic): [phone] x 1 Neuropsychological Test Administration and Scoring (Technician): [phone] x 3  This report was generated using voice recognition software. While this document has been carefully reviewed, transcription errors may be present. I apologize in advance for any inconvenience. Please contact me if further clarification is needed.            Renda Beckwith, Psy.D.             Neuropsychologist  "

## 2024-04-28 NOTE — Progress Notes (Signed)
" ° °  Psychometrician Note   Cognitive testing was administered to Salomon JONELLE Das by Lonell Jude, B.S. (psychometrist) under the supervision of Dr. Renda Beckwith, Psy.D., licensed psychologist on 04/28/2024. Mr. Cambria did not appear overtly distressed by the testing session per behavioral observation or responses across self-report questionnaires. Rest breaks were offered.   The battery of tests administered was selected by Dr. Renda Beckwith, Psy.D. with consideration to Mr. Switalski current level of functioning, the nature of his symptoms, emotional and behavioral responses during interview, level of literacy, observed level of motivation/effort, and the nature of the referral question. This battery was communicated to the psychometrist. Communication between Dr. Renda Beckwith, Psy.D. and the psychometrist was ongoing throughout the evaluation and Dr. Renda Beckwith, Psy.D. was immediately accessible at all times. Dr. Renda Beckwith, Psy.D. provided supervision to the psychometrist on the date of this service to the extent necessary to assure the quality of all services provided.    RUHAN BORAK will return within approximately 1-2 weeks for an interactive feedback session with Dr. Beckwith at which time his test performances, clinical impressions, and treatment recommendations will be reviewed in detail. Mr. Jentz understands he can contact our office should he require our assistance before this time.  A total of 130 minutes of billable time were spent face-to-face with Mr. Kaltenbach by the psychometrist. This includes both test administration and scoring time. Billing for these services is reflected in the clinical report generated by Dr. Renda Beckwith, Psy.D.  This note reflects time spent with the psychometrician and does not include test scores or any clinical interpretations made by Dr. Beckwith. The full report will follow in a separate note. "

## 2024-05-05 ENCOUNTER — Telehealth: Payer: Self-pay | Admitting: Physician Assistant

## 2024-05-05 ENCOUNTER — Ambulatory Visit: Admitting: Psychology

## 2024-05-05 DIAGNOSIS — F04 Amnestic disorder due to known physiological condition: Secondary | ICD-10-CM | POA: Diagnosis not present

## 2024-05-05 DIAGNOSIS — G3184 Mild cognitive impairment, so stated: Secondary | ICD-10-CM | POA: Diagnosis not present

## 2024-05-05 NOTE — Telephone Encounter (Signed)
 Calvin Salazar from [employer] called wanting to know if our office is sending pt for a sleep study or does their office need to get it set up. The patient told them our office told him he needs a sleep study. Call office and ask to speak with kelly ext [phone].

## 2024-05-05 NOTE — Progress Notes (Signed)
" ° °  NEUROPSYCHOLOGY FEEDBACK SESSION [place]. [facility]  [place] Department of Neurology  Date of Feedback Session: 05/05/2024  REASON FOR REFERRAL   Calvin Salazar is a 74 year old, right-handed, White male with 16 years of formal education. He was referred for neuropsychological evaluation by Camie Sevin, PA-C, to assess current neurocognitive functioning, document potential cognitive deficits, and assist with treatment planning. This is his first neuropsychological evaluation.  FEEDBACK   Patient completed a comprehensive neuropsychological evaluation on 04/28/2024. Please refer to that encounter for the full report and recommendations. Briefly, results indicated primary deficits in memory and confrontation naming, with performance in other cognitive domains largely preserved. There is somewhat of a discrepancy between his functional needs and his cognitive test scores, which complicates diagnosis. His presentation is borderline, and an argument could be made for either mild cognitive impairment or very mild dementia. The overall pattern of deficits certainly raises concern for a possible early neurodegenerative process. Additional neurological workup (e.g., blood-based biomarkers, CSF analysis, amyloid imaging) may help clarify the underlying etiology. He has untreated sleep apnea, which could contribute to cognitive difficulties, though it is unlikely to fully explain some of the weaknesses observed today. He also has a few vascular risk factors; however, neuroimaging demonstrates only mild small vessel ischemic changes, which is reassuring.   Today, the patient was accompanied by his partner. They were provided verbal feedback regarding the findings and impression during this visit, and their questions were answered. A copy of the report was provided at the conclusion of the visit.  DISPOSITION   Patient will follow up with the referring provider, Ms. Wertman. He should return for  repeat neuropsychological testing in 12-18 months to monitor his course and assist with diagnosis and treatment planning.  SERVICE   This feedback session was conducted by Renda Beckwith, Psy.D. One unit of 03867 (50 minutes) was billed for Dr. Beckwith' time spent in preparing, conducting, and documenting the current feedback session.  This report was generated using voice recognition software. While this document has been carefully reviewed, transcription errors may be present. I apologize in advance for any inconvenience. Please contact me if further clarification is needed.  "

## 2024-05-05 NOTE — Telephone Encounter (Signed)
 I left message on [facility] voicemail to go ahead and have them scheduled the sleep study.

## 2024-08-28 ENCOUNTER — Ambulatory Visit: Admitting: Physician Assistant

## 2025-05-07 ENCOUNTER — Institutional Professional Consult (permissible substitution): Payer: Self-pay | Admitting: Psychology

## 2025-05-07 ENCOUNTER — Ambulatory Visit: Payer: Self-pay

## 2025-05-14 ENCOUNTER — Encounter: Payer: Self-pay | Admitting: Psychology
